# Patient Record
Sex: Male | Born: 1990 | Race: White | Hispanic: No | Marital: Single | Smoking: Former smoker
Health system: Southern US, Community
[De-identification: ages and names within clinical notes are randomized; demographics above are authoritative.]

## PROBLEM LIST (undated history)

## (undated) HISTORY — PX: ANKLE SURGERY: SHX546

---
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
## 2014-09-29 ENCOUNTER — Emergency Department (HOSPITAL_COMMUNITY)
Admission: EM | Admit: 2014-09-29 | Discharge: 2014-09-29 | Disposition: A | Discharge status: Home / Self Care | Payer: Self-pay

## 2014-09-29 NOTE — ED Notes (Signed)
No answer

## 2014-10-06 ENCOUNTER — Emergency Department (HOSPITAL_COMMUNITY)
Admission: EM | Admit: 2014-10-06 | Discharge: 2014-10-06 | Disposition: A | Discharge status: Home / Self Care | Payer: BC Managed Care – PPO | Attending: Emergency Medicine | Admitting: Emergency Medicine

## 2014-10-06 ENCOUNTER — Emergency Department (HOSPITAL_COMMUNITY): Payer: BC Managed Care – PPO

## 2014-10-06 ENCOUNTER — Encounter (HOSPITAL_COMMUNITY): Payer: Self-pay | Admitting: Emergency Medicine

## 2014-10-06 DIAGNOSIS — S99911A Unspecified injury of right ankle, initial encounter: Secondary | ICD-10-CM | POA: Diagnosis present

## 2014-10-06 DIAGNOSIS — Y92310 Basketball court as the place of occurrence of the external cause: Secondary | ICD-10-CM | POA: Diagnosis not present

## 2014-10-06 DIAGNOSIS — Y9367 Activity, basketball: Secondary | ICD-10-CM | POA: Diagnosis not present

## 2014-10-06 DIAGNOSIS — S93401A Sprain of unspecified ligament of right ankle, initial encounter: Secondary | ICD-10-CM

## 2014-10-06 DIAGNOSIS — S93491A Sprain of other ligament of right ankle, initial encounter: Secondary | ICD-10-CM | POA: Diagnosis not present

## 2014-10-06 DIAGNOSIS — W2105XA Struck by basketball, initial encounter: Secondary | ICD-10-CM | POA: Insufficient documentation

## 2014-10-06 NOTE — ED Notes (Signed)
Pain rt ankle .Has been hurting for few weeks, but worse when playing basketball yesterday, and heard a 'pop",

## 2014-10-06 NOTE — ED Notes (Addendum)
Patient states "I rolled my right ankle yesterday playing basketball." Patient ambulatory at triage.

## 2014-10-06 NOTE — ED Provider Notes (Signed)
CSN: [id number]     Arrival date & time 10/06/14  1740 History  This chart was scribed for non-physician practitioner Pauline Ausammy Manjot Hinks, PA-C working with Donnetta HutchingBrian Cook, MD by Littie Deedsichard Sun, ED Scribe. This patient was seen in room [facility] and the patient's care was started at 6:34 PM.      Chief Complaint  Patient presents with  . Ankle Pain      The history is provided by the patient. No language interpreter was used.   HPI Comments: Edwin Oneill is a 23 y.o. male who presents to the Emergency Department complaining of sudden onset, constant right ankle pain with swelling that began yesterday after he rolled his right ankle while playing basketball. He noticed popping during the injury. He has noted injuring his ankle while playing football 3 years ago. Patient has applied ice which has decreased swelling and has taken Advil that has alleviated some of the pain. He denies any other injuries.   History reviewed. No pertinent past medical history. History reviewed. No pertinent past surgical history. History reviewed. No pertinent family history. History  Substance Use Topics  . Smoking status: Never Smoker   . Smokeless tobacco: Not on file  . Alcohol Use: Yes     Comment: occ    Review of Systems  Constitutional: Negative for fever.  Genitourinary: Negative for flank pain.  Musculoskeletal: Positive for arthralgias and joint swelling. Negative for back pain.  Skin: Negative for rash and wound.  Neurological: Negative for headaches.  Psychiatric/Behavioral: Negative for hallucinations.  All other systems reviewed and are negative.     Allergies  Coppertone spf4  Home Medications   Prior to Admission medications   Not on File   BP 116/69  Pulse 65  Temp(Src) 98.8 F (37.1 C) (Oral)  Ht 6\' 3"  (1.905 m)  Wt 165 lb (74.844 kg)  BMI 20.62 kg/m2  SpO2 100% Physical Exam  Nursing note and vitals reviewed. Constitutional: He is oriented to person, place, and time.  He appears well-developed and well-nourished. No distress.  HENT:  Head: Normocephalic and atraumatic.  Mouth/Throat: Oropharynx is clear and moist. No oropharyngeal exudate.  Eyes: Pupils are equal, round, and reactive to light.  Cardiovascular: Normal rate, regular rhythm, normal heart sounds and intact distal pulses.   No murmur heard. Pulmonary/Chest: Effort normal and breath sounds normal. No respiratory distress. He has no wheezes. He has no rales.  Musculoskeletal: He exhibits tenderness. He exhibits no edema.  Tenderness at the anterior and medial right ankle. Mild soft tissue swelling. No erythema or bony deformity. Sensation intact.  Compartments of the right LE are soft.  DP pulse is brisk  Neurological: He is alert and oriented to person, place, and time. No cranial nerve deficit.  Skin: Skin is warm and dry. No rash noted.  Psychiatric: He has a normal mood and affect. His behavior is normal.    ED Course  Procedures  DIAGNOSTIC STUDIES: Oxygen Saturation is 100% on RA, nml by my interpretation.    COORDINATION OF CARE: 6:39 PM-Discussed treatment plan which includes crutches and a work note with pt at bedside and pt agreed to plan.   Labs Review Labs Reviewed - No data to display  Imaging Review Dg Ankle Complete Right  10/06/2014   CLINICAL DATA:  Popping sensation right ankle for 1 week. Patient was playing basketball and twisted the ankle.  EXAM: RIGHT ANKLE - COMPLETE 3+ VIEW  COMPARISON:  None.  FINDINGS: There is no evidence of  fracture, dislocation, or joint effusion. There is no evidence of arthropathy or other focal bone abnormality. Soft tissues are unremarkable.  IMPRESSION: No acute osseous injury of the right ankle.   Electronically Signed   By: Elige KoHetal  Patel   On: 10/06/2014 18:15     EKG Interpretation None      ASO applied and crutches given.  Pain improved , remains NV intact  MDM   Final diagnoses:  Ankle sprain, right, initial encounter     Pt is well appearing.  Likely ankle sprain.  Agrees to RICE and orthopedic f/u if needed.    I personally performed the services described in this documentation, which was scribed in my presence. The recorded information has been reviewed and is accurate.    Wynonia Medero L. Lyell Clugston, PA-C 10/08/14 1249

## 2014-10-06 NOTE — Discharge Instructions (Signed)
Ankle Sprain  An ankle sprain is an injury to the strong, fibrous tissues (ligaments) that hold your ankle bones together.   HOME CARE   · Put ice on your ankle for 1-2 days or as told by your doctor.  ¨ Put ice in a plastic bag.  ¨ Place a towel between your skin and the bag.  ¨ Leave the ice on for 15-20 minutes at a time, every 2 hours while you are awake.  · Only take medicine as told by your doctor.  · Raise (elevate) your injured ankle above the level of your heart as much as possible for 2-3 days.  · Use crutches if your doctor tells you to. Slowly put your own weight on the affected ankle. Use the crutches until you can walk without pain.  · If you have a plaster splint:  ¨ Do not rest it on anything harder than a pillow for 24 hours.  ¨ Do not put weight on it.  ¨ Do not get it wet.  ¨ Take it off to shower or bathe.  · If given, use an elastic wrap or support stocking for support. Take the wrap off if your toes lose feeling (numb), tingle, or turn cold or blue.  · If you have an air splint:  ¨ Add or let out air to make it comfortable.  ¨ Take it off at night and to shower and bathe.  ¨ Wiggle your toes and move your ankle up and down often while you are wearing it.  GET HELP IF:  · You have rapidly increasing bruising or puffiness (swelling).  · Your toes feel very cold.  · You lose feeling in your foot.  · Your medicine does not help your pain.  GET HELP RIGHT AWAY IF:   · Your toes lose feeling (numb) or turn blue.  · You have severe pain that is increasing.  MAKE SURE YOU:   · Understand these instructions.  · Will watch your condition.  · Will get help right away if you are not doing well or get worse.  Document Released: 05/30/2008 Document Revised: 04/28/2014 Document Reviewed: 06/25/2012  [employer]® Patient Information ©2015 [employer]. This information is not intended to replace advice given to you by your health care provider. Make sure you discuss any questions you have with your health care  provider.

## 2014-10-10 NOTE — ED Provider Notes (Signed)
Medical screening examination/treatment/procedure(s) were performed by non-physician practitioner and as supervising physician I was immediately available for consultation/collaboration.   EKG Interpretation None       Donnetta HutchingBrian Yuvan Medinger, MD 10/10/14 1407

## 2015-09-15 IMAGING — CR DG ANKLE COMPLETE 3+V*R*
3 series · 3 of 3 positions shown · non-contrast
Comparison: None.

CLINICAL DATA: Popping sensation right ankle for 1 week. Patient
was playing basketball and twisted the ankle.

EXAM:
RIGHT ANKLE - COMPLETE 3+ VIEW

[view not recorded (1 of 3)]
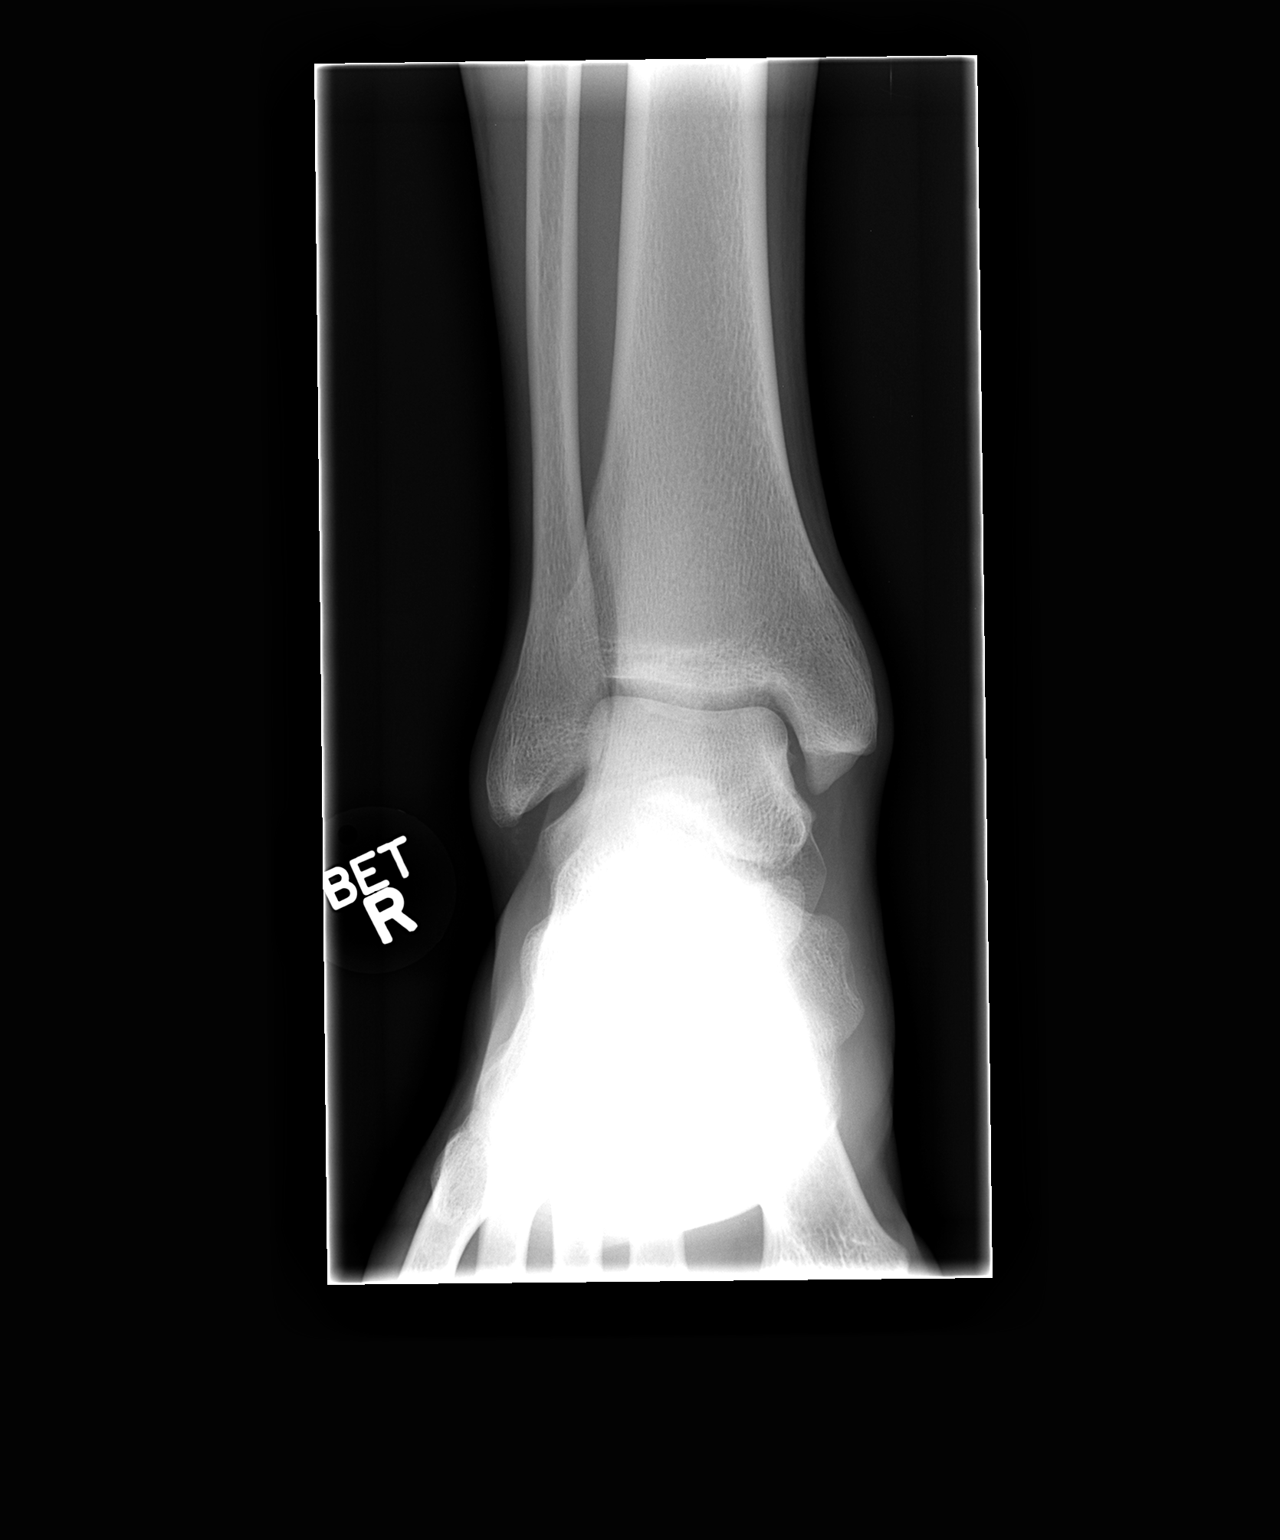

[view not recorded (2 of 3)]
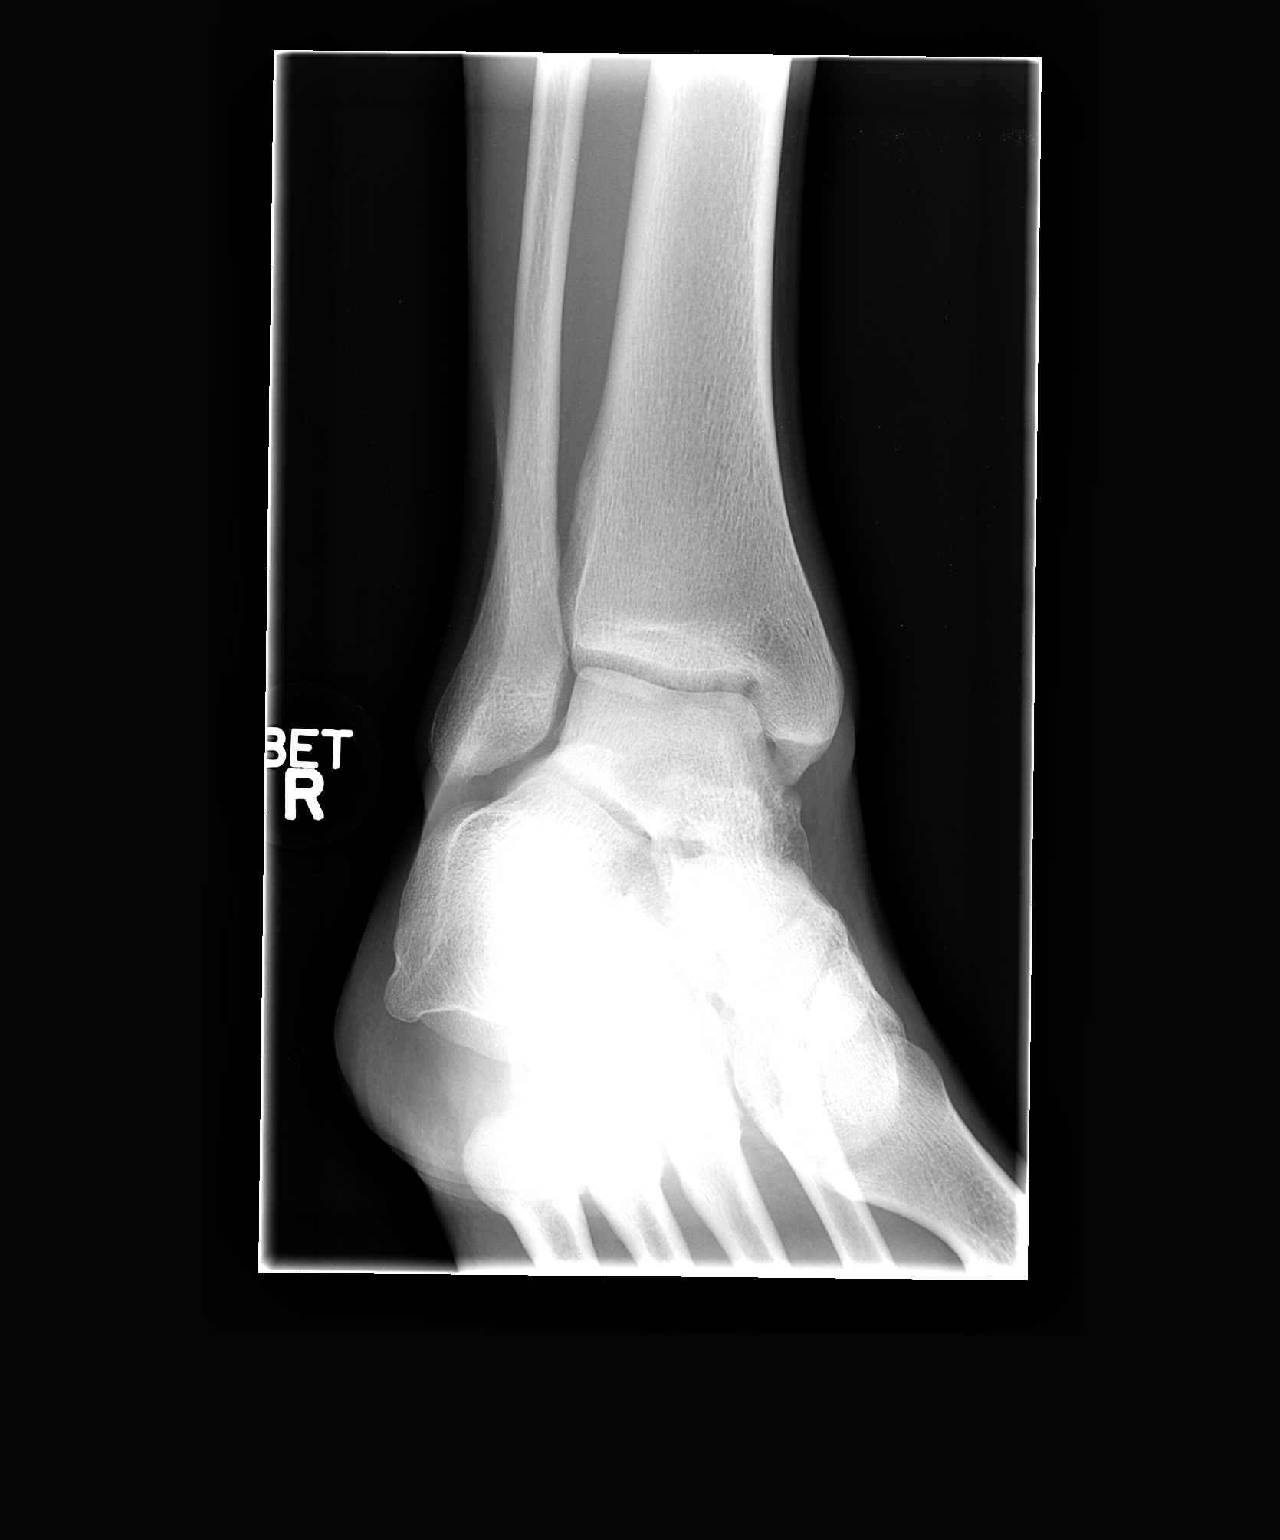

[view not recorded (3 of 3)]
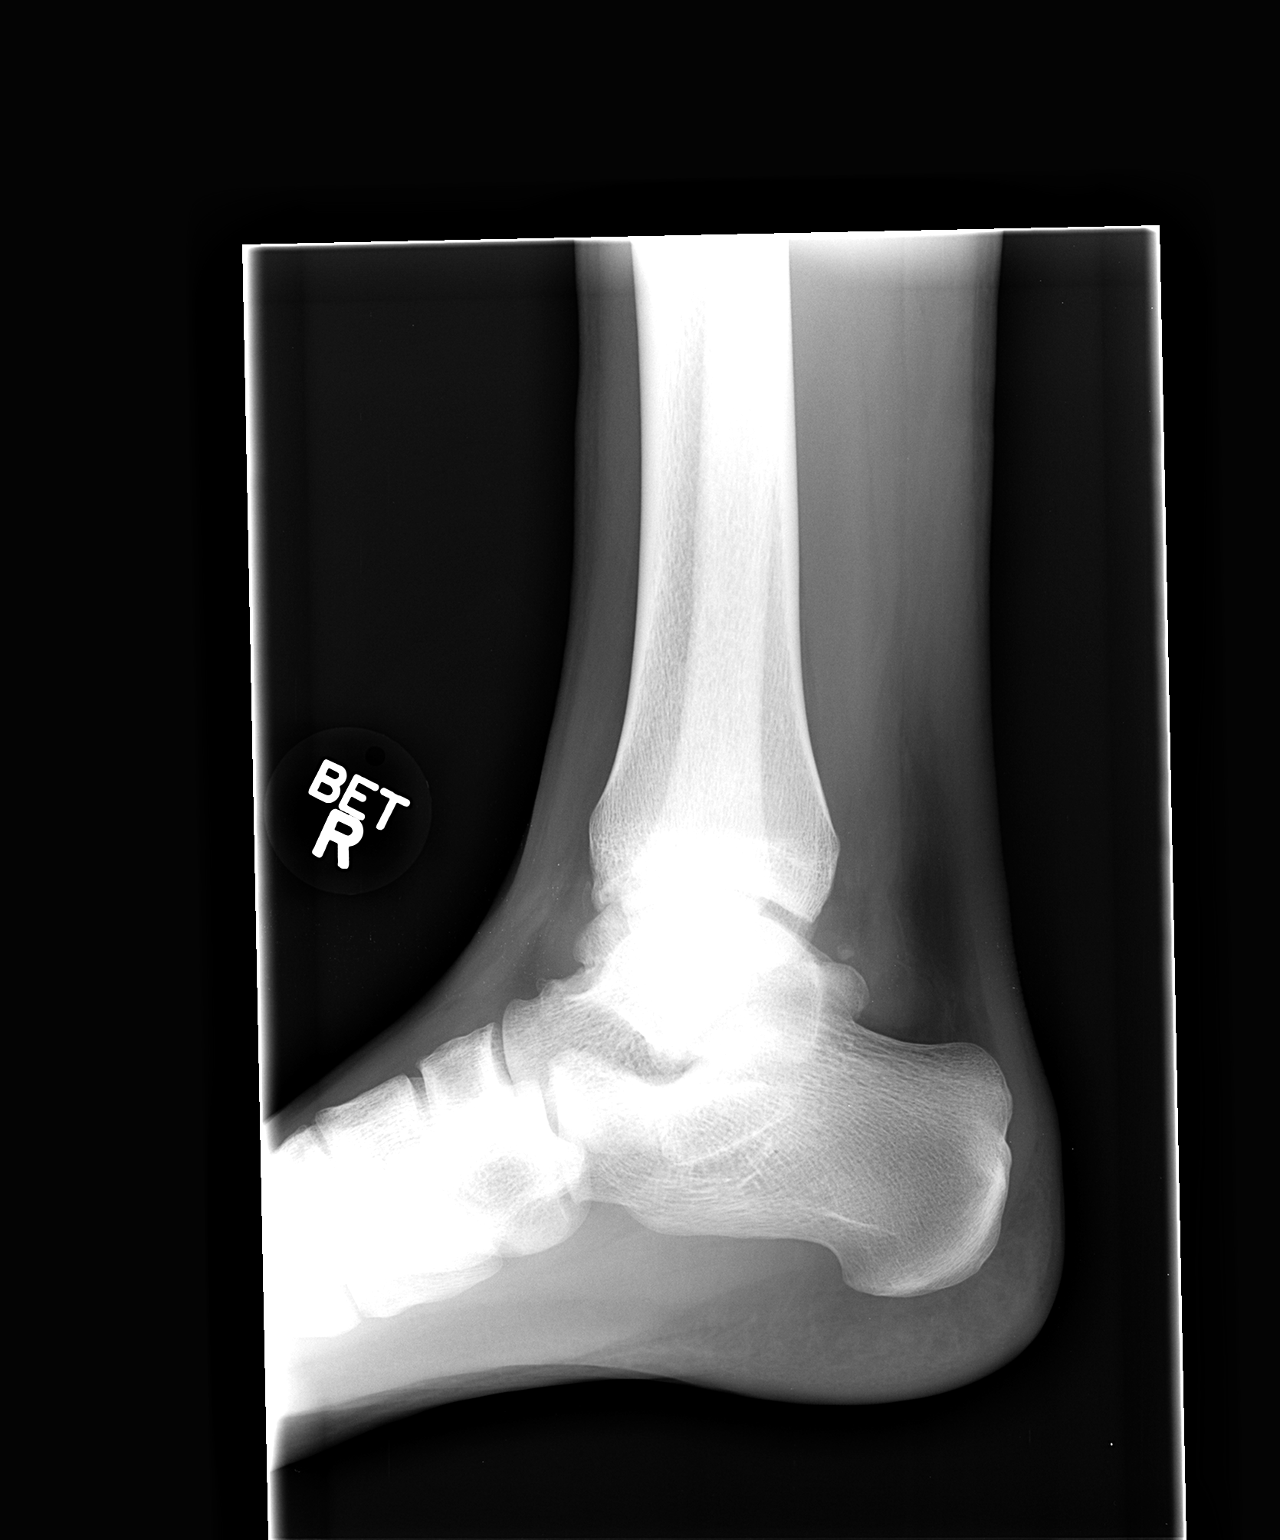

[3 of 3 positions shown; findings below may reference images not displayed]

FINDINGS: There is no evidence of fracture, dislocation, or joint effusion.
There is no evidence of arthropathy or other focal bone abnormality.
Soft tissues are unremarkable.
IMPRESSION: No acute osseous injury of the right ankle.

## 2017-03-04 ENCOUNTER — Encounter (HOSPITAL_COMMUNITY): Payer: Self-pay | Admitting: Emergency Medicine

## 2017-03-04 ENCOUNTER — Emergency Department (HOSPITAL_COMMUNITY)
Admission: EM | Admit: 2017-03-04 | Discharge: 2017-03-04 | Disposition: A | Discharge status: Home / Self Care | Payer: Self-pay | Attending: Emergency Medicine | Admitting: Emergency Medicine

## 2017-03-04 DIAGNOSIS — F17210 Nicotine dependence, cigarettes, uncomplicated: Secondary | ICD-10-CM | POA: Insufficient documentation

## 2017-03-04 DIAGNOSIS — R112 Nausea with vomiting, unspecified: Secondary | ICD-10-CM

## 2017-03-04 DIAGNOSIS — E860 Dehydration: Secondary | ICD-10-CM | POA: Insufficient documentation

## 2017-03-04 LAB — CBC WITH DIFFERENTIAL/PLATELET
Basophils Absolute: 0 10*3/uL (ref 0.0–0.1)
Basophils Relative: 0 %
EOS ABS: 0 10*3/uL (ref 0.0–0.7)
EOS PCT: 0 %
HCT: 44.9 % (ref 39.0–52.0)
Hemoglobin: 15.5 g/dL (ref 13.0–17.0)
LYMPHS ABS: 1.3 10*3/uL (ref 0.7–4.0)
LYMPHS PCT: 11 %
MCH: 31.3 pg (ref 26.0–34.0)
MCHC: 34.5 g/dL (ref 30.0–36.0)
MCV: 90.7 fL (ref 78.0–100.0)
MONO ABS: 0.5 10*3/uL (ref 0.1–1.0)
MONOS PCT: 4 %
Neutro Abs: 10 10*3/uL — ABNORMAL HIGH (ref 1.7–7.7)
Neutrophils Relative %: 85 %
PLATELETS: 245 10*3/uL (ref 150–400)
RBC: 4.95 MIL/uL (ref 4.22–5.81)
RDW: 13.2 % (ref 11.5–15.5)
WBC: 11.8 10*3/uL — ABNORMAL HIGH (ref 4.0–10.5)

## 2017-03-04 LAB — COMPREHENSIVE METABOLIC PANEL
ALT: 20 U/L (ref 17–63)
ANION GAP: 10 (ref 5–15)
AST: 27 U/L (ref 15–41)
Albumin: 4.9 g/dL (ref 3.5–5.0)
Alkaline Phosphatase: 48 U/L (ref 38–126)
BUN: 11 mg/dL (ref 6–20)
CALCIUM: 10.5 mg/dL — AB (ref 8.9–10.3)
CHLORIDE: 108 mmol/L (ref 101–111)
CO2: 25 mmol/L (ref 22–32)
CREATININE: 1.08 mg/dL (ref 0.61–1.24)
Glucose, Bld: 93 mg/dL (ref 65–99)
Potassium: 3.7 mmol/L (ref 3.5–5.1)
SODIUM: 143 mmol/L (ref 135–145)
Total Bilirubin: 1.7 mg/dL — ABNORMAL HIGH (ref 0.3–1.2)
Total Protein: 8.3 g/dL — ABNORMAL HIGH (ref 6.5–8.1)

## 2017-03-04 MED ORDER — SODIUM CHLORIDE 0.9 % IV BOLUS (SEPSIS)
1000.0000 mL | Freq: Once | INTRAVENOUS | Status: AC
  Administered 2017-03-04: 1000 mL via INTRAVENOUS

## 2017-03-04 MED ORDER — ONDANSETRON 8 MG PO TBDP: 8.0000 mg | ORAL_TABLET | Freq: Three times a day (TID) | ORAL | 0 refills | Status: DC | PRN

## 2017-03-04 NOTE — ED Triage Notes (Signed)
Pt reports facial numbness since last pm Also reports weakness- (pt on cell phone and reluctant to not use while checking in) No physician

## 2017-03-04 NOTE — ED Triage Notes (Signed)
Pt appears anxious stating that he feels that he will pass out while walking around waiting room- encouraged to return to his wheel chair

## 2017-03-04 NOTE — ED Notes (Signed)
Pt reports his children recently had to flu and bronchitis.

## 2017-03-04 NOTE — ED Provider Notes (Signed)
[facility]-EMERGENCY DEPT Provider Note   CSN: [id number] Arrival date & time: 03/04/17  1253     History   Chief Complaint Chief Complaint  Patient presents with  . Weakness    HPI Edwin Oneill is a 26 y.o. male.  HPI Patient presents to the emergency department with complaints of nausea vomiting and generalized weakness.  He reports lightheaded with standing.  He reports chills without documented fever.  No blood in his vomit.  No blood in stool.  Denies diarrhea.  Reports crampy abdominal pain.  Symptoms are moderate in severity.  No other complaints.  No chest pain shortness breath.   History reviewed. No pertinent past medical history.  Patient Active Problem List   Diagnosis Date Noted  . KNEE PAIN, RIGHT 10/11/2007    History reviewed. No pertinent surgical history.     Home Medications    Prior to Admission medications   Medication Sig Start Date End Date Taking? Authorizing Provider  ondansetron (ZOFRAN ODT) 8 MG disintegrating tablet Take 1 tablet (8 mg total) by mouth every 8 (eight) hours as needed for nausea or vomiting. 03/04/17   Azalia Bilis, MD    Family History History reviewed. No pertinent family history.  Social History Social History  Substance Use Topics  . Smoking status: Current Every Day Smoker    Packs/day: 0.50    Types: Cigarettes  . Smokeless tobacco: Not on file  . Alcohol use Yes     Comment: occ     Allergies   Ray block sunscreen   Review of Systems Review of Systems  All other systems reviewed and are negative.    Physical Exam Updated Vital Signs BP 109/71   Pulse (!) 52   Temp 97.6 F (36.4 C) (Oral)   Resp 14   Ht 6\' 3"  (1.905 m)   Wt 185 lb (83.9 kg)   SpO2 100%   BMI 23.12 kg/m   Physical Exam  Constitutional: He is oriented to person, place, and time. He appears well-developed and well-nourished.  HENT:  Head: Normocephalic and atraumatic.  Eyes: EOM are normal.  Neck: Normal range of motion.    Cardiovascular: Normal rate, regular rhythm and normal heart sounds.   Pulmonary/Chest: Effort normal and breath sounds normal. No respiratory distress.  Abdominal: Soft. He exhibits no distension. There is no tenderness.  Musculoskeletal: Normal range of motion.  Neurological: He is alert and oriented to person, place, and time.  Skin: Skin is warm and dry.  Psychiatric: He has a normal mood and affect. Judgment normal.  Nursing note and vitals reviewed.    ED Treatments / Results  Labs (all labs ordered are listed, but only abnormal results are displayed) Labs Reviewed  CBC WITH DIFFERENTIAL/PLATELET - Abnormal; Notable for the following:       Result Value   WBC 11.8 (*)    Neutro Abs 10.0 (*)    All other components within normal limits  COMPREHENSIVE METABOLIC PANEL - Abnormal; Notable for the following:    Calcium 10.5 (*)    Total Protein 8.3 (*)    Total Bilirubin 1.7 (*)    All other components within normal limits    EKG  EKG Interpretation None       Radiology No results found.  Procedures Procedures (including critical care time)  Medications Ordered in ED Medications  sodium chloride 0.9 % bolus 1,000 mL (1,000 mLs Intravenous New Bag/Given 03/04/17 1426)     Initial Impression / Assessment and  Plan / ED Course  I have reviewed the triage vital signs and the nursing notes.  Pertinent labs & imaging results that were available during my care of the patient were reviewed by me and considered in my medical decision making (see chart for details).     Patient given IV fluids.  Feels much better at this time.  Repeat abdominal exam without focal tenderness.  Likely viral process.  Discharge home in good condition.  Primary care follow-up.  Patient understands to return to the ER for new or worsening symptoms  Final Clinical Impressions(s) / ED Diagnoses   Final diagnoses:  Nausea and vomiting in adult  Dehydration    New Prescriptions New  Prescriptions   ONDANSETRON (ZOFRAN ODT) 8 MG DISINTEGRATING TABLET    Take 1 tablet (8 mg total) by mouth every 8 (eight) hours as needed for nausea or vomiting.     Azalia BilisKevin Jamir Rone, MD 03/04/17 [phone]

## 2017-03-04 NOTE — ED Triage Notes (Signed)
Pt reports facial numbness with generalized weakness and dry heaves since last night.

## 2019-06-11 ENCOUNTER — Emergency Department (HOSPITAL_COMMUNITY): Payer: Self-pay

## 2019-06-11 ENCOUNTER — Other Ambulatory Visit: Payer: Self-pay

## 2019-06-11 ENCOUNTER — Emergency Department (HOSPITAL_COMMUNITY)
Admission: EM | Admit: 2019-06-11 | Discharge: 2019-06-11 | Disposition: A | Discharge status: Home / Self Care | Payer: Self-pay | Attending: Emergency Medicine | Admitting: Emergency Medicine

## 2019-06-11 ENCOUNTER — Ambulatory Visit (HOSPITAL_COMMUNITY): Payer: Self-pay

## 2019-06-11 ENCOUNTER — Encounter (HOSPITAL_COMMUNITY): Payer: Self-pay | Admitting: Emergency Medicine

## 2019-06-11 DIAGNOSIS — R112 Nausea with vomiting, unspecified: Secondary | ICD-10-CM | POA: Insufficient documentation

## 2019-06-11 DIAGNOSIS — R1084 Generalized abdominal pain: Secondary | ICD-10-CM | POA: Insufficient documentation

## 2019-06-11 DIAGNOSIS — R197 Diarrhea, unspecified: Secondary | ICD-10-CM | POA: Insufficient documentation

## 2019-06-11 DIAGNOSIS — F17210 Nicotine dependence, cigarettes, uncomplicated: Secondary | ICD-10-CM | POA: Insufficient documentation

## 2019-06-11 LAB — COMPREHENSIVE METABOLIC PANEL
ALT: 22 U/L (ref 0–44)
AST: 24 U/L (ref 15–41)
Albumin: 4.5 g/dL (ref 3.5–5.0)
Alkaline Phosphatase: 45 U/L (ref 38–126)
Anion gap: 11 (ref 5–15)
BUN: 10 mg/dL (ref 6–20)
CO2: 22 mmol/L (ref 22–32)
Calcium: 9.8 mg/dL (ref 8.9–10.3)
Chloride: 106 mmol/L (ref 98–111)
Creatinine, Ser: 1.05 mg/dL (ref 0.61–1.24)
GFR calc Af Amer: >60 mL/min (ref >60)
GFR calc non Af Amer: >60 mL/min (ref >60)
Glucose, Bld: 107 mg/dL — ABNORMAL HIGH (ref 70–99)
Potassium: 3.9 mmol/L (ref 3.5–5.1)
Sodium: 139 mmol/L (ref 135–145)
Total Bilirubin: 1.3 mg/dL — ABNORMAL HIGH (ref 0.3–1.2)
Total Protein: 7.6 g/dL (ref 6.5–8.1)

## 2019-06-11 LAB — LIPASE, BLOOD: Lipase: 28 U/L (ref 11–51)

## 2019-06-11 LAB — CBC
HCT: 44.5 % (ref 39.0–52.0)
Hemoglobin: 15.4 g/dL (ref 13.0–17.0)
MCH: 31.6 pg (ref 26.0–34.0)
MCHC: 34.6 g/dL (ref 30.0–36.0)
MCV: 91.2 fL (ref 80.0–100.0)
Platelets: 228 10*3/uL (ref 150–400)
RBC: 4.88 MIL/uL (ref 4.22–5.81)
RDW: 12.6 % (ref 11.5–15.5)
WBC: 9.3 10*3/uL (ref 4.0–10.5)
nRBC: 0 % (ref 0.0–0.2)

## 2019-06-11 MED ORDER — ONDANSETRON 4 MG PO TBDP
ORAL_TABLET | ORAL | Status: AC
  Filled 2019-06-11: qty 1

## 2019-06-11 MED ORDER — SODIUM CHLORIDE 0.9% FLUSH
3.0000 mL | Freq: Once | INTRAVENOUS | Status: AC
  Administered 2019-06-11: 3 mL via INTRAVENOUS

## 2019-06-11 MED ORDER — SODIUM CHLORIDE 0.9 % IV SOLN
1000.0000 mL | INTRAVENOUS | Status: DC
  Administered 2019-06-11: 1000 mL via INTRAVENOUS

## 2019-06-11 MED ORDER — IOHEXOL 300 MG/ML  SOLN: 100.0000 mL | Freq: Once | INTRAMUSCULAR | Status: DC | PRN

## 2019-06-11 MED ORDER — SODIUM CHLORIDE 0.9 % IV BOLUS (SEPSIS)
1000.0000 mL | Freq: Once | INTRAVENOUS | Status: AC
  Administered 2019-06-11: 1000 mL via INTRAVENOUS

## 2019-06-11 MED ORDER — ONDANSETRON 4 MG PO TBDP
4.0000 mg | ORAL_TABLET | Freq: Once | ORAL | Status: AC | PRN
  Administered 2019-06-11: 4 mg via ORAL

## 2019-06-11 MED ORDER — PROCHLORPERAZINE EDISYLATE 10 MG/2ML IJ SOLN
5.0000 mg | Freq: Once | INTRAMUSCULAR | Status: AC
  Administered 2019-06-11: 5 mg via INTRAVENOUS
  Filled 2019-06-11: qty 2

## 2019-06-11 NOTE — ED Triage Notes (Signed)
Pt c/o n/v/d and nasal congestion since 0530 this am. Denies fever.

## 2019-06-11 NOTE — ED Notes (Signed)
Pt was informed that we need a urine sample. 

## 2019-06-11 NOTE — ED Notes (Signed)
Pt is requesting to be discharged.  Jackquline Berlin, PA in to speak with pt.

## 2019-06-11 NOTE — ED Notes (Signed)
Pt not able to void at this time.

## 2019-06-11 NOTE — ED Provider Notes (Signed)
[facility] EMERGENCY DEPARTMENT Provider Note   CSN: [id number] Arrival date & time: 06/11/19  1327     History   Chief Complaint Chief Complaint  Patient presents with  . Emesis    HPI Edwin Oneill is a 28 y.o. male.     Patient is a 28 year old male who presents to the emergency department with nausea, vomiting, diarrhea, and congestion.  The patient states this problem started approximately 5:30 AM.  He says during the day today he has had about 6 episodes of vomiting and a couple of episodes of diarrhea.  He presents now with some congestion present, but also has diffuse abdominal pain.  He says he does not have much of an appetite.  He has not measured a temperature elevation.  He has not had a loss of taste or smell.  He has not been around anyone with known diagnosis of COVID-19.  He has not been traveling recently.  He does not have any known medical issues at this time.  The history is provided by the patient.  Emesis Associated symptoms: abdominal pain and diarrhea   Associated symptoms: no arthralgias and no cough     History reviewed. No pertinent past medical history.  Patient Active Problem List   Diagnosis Date Noted  . KNEE PAIN, RIGHT 10/11/2007    Past Surgical History:  Procedure Laterality Date  . ANKLE SURGERY Right         Home Medications    Prior to Admission medications   Medication Sig Start Date End Date Taking? Authorizing Provider  ondansetron (ZOFRAN ODT) 8 MG disintegrating tablet Take 1 tablet (8 mg total) by mouth every 8 (eight) hours as needed for nausea or vomiting. 03/04/17   Jola Schmidt, MD    Family History No family history on file.  Social History Social History   Tobacco Use  . Smoking status: Current Every Day Smoker    Packs/day: 0.50    Types: Cigarettes  . Smokeless tobacco: Never Used  Substance Use Topics  . Alcohol use: Yes    Comment: occ  . Drug use: No     Allergies   Solbar pf spf15    Review of Systems Review of Systems  Constitutional: Negative for activity change.       All ROS Neg except as noted in HPI  HENT: Positive for congestion. Negative for nosebleeds.   Eyes: Negative for photophobia and discharge.  Respiratory: Negative for cough, shortness of breath and wheezing.   Cardiovascular: Negative for chest pain and palpitations.  Gastrointestinal: Positive for abdominal pain, diarrhea and vomiting. Negative for blood in stool.  Genitourinary: Negative for dysuria, frequency and hematuria.  Musculoskeletal: Negative for arthralgias, back pain and neck pain.  Skin: Negative.   Neurological: Negative for dizziness, seizures and speech difficulty.  Psychiatric/Behavioral: Negative for confusion and hallucinations.     Physical Exam Updated Vital Signs BP 116/80   Pulse 60   Temp 98 F (36.7 C)   Resp (!) 22   Ht 6\' 3"  (1.905 m)   Wt 83 kg   SpO2 100%   BMI 22.87 kg/m   Physical Exam Vitals signs and nursing note reviewed.  Constitutional:      Appearance: He is well-developed. He is not toxic-appearing.  HENT:     Head: Normocephalic.     Right Ear: Tympanic membrane and external ear normal.     Left Ear: Tympanic membrane and external ear normal.  Eyes:  General: Lids are normal.     Pupils: Pupils are equal, round, and reactive to light.  Neck:     Musculoskeletal: Normal range of motion and neck supple.     Vascular: No carotid bruit.  Cardiovascular:     Rate and Rhythm: Normal rate and regular rhythm.     Pulses: Normal pulses.     Heart sounds: Normal heart sounds.  Pulmonary:     Effort: No respiratory distress.     Breath sounds: Normal breath sounds.  Abdominal:     General: Abdomen is flat. Bowel sounds are normal.     Palpations: Abdomen is soft. There is no hepatomegaly or splenomegaly.     Tenderness: There is generalized abdominal tenderness. There is no right CVA tenderness, left CVA tenderness or guarding.   Musculoskeletal: Normal range of motion.  Lymphadenopathy:     Head:     Right side of head: No submandibular adenopathy.     Left side of head: No submandibular adenopathy.     Cervical: No cervical adenopathy.  Skin:    General: Skin is warm and dry.  Neurological:     Mental Status: He is alert and oriented to person, place, and time.     Cranial Nerves: No cranial nerve deficit.     Sensory: No sensory deficit.  Psychiatric:        Speech: Speech normal.      ED Treatments / Results  Labs (all labs ordered are listed, but only abnormal results are displayed) Labs Reviewed  COMPREHENSIVE METABOLIC PANEL - Abnormal; Notable for the following components:      Result Value   Glucose, Bld 107 (*)    Total Bilirubin 1.3 (*)    All other components within normal limits  LIPASE, BLOOD  CBC  URINALYSIS, ROUTINE W REFLEX MICROSCOPIC    EKG    Radiology No results found.  Procedures Procedures (including critical care time)  Medications Ordered in ED Medications  sodium chloride flush (NS) 0.9 % injection 3 mL (has no administration in time range)  ondansetron (ZOFRAN-ODT) 4 MG disintegrating tablet (has no administration in time range)  ondansetron (ZOFRAN-ODT) disintegrating tablet 4 mg (4 mg Oral Given 06/11/19 1356)     Initial Impression / Assessment and Plan / ED Course  I have reviewed the triage vital signs and the nursing notes.  Pertinent labs & imaging results that were available during my care of the patient were reviewed by me and considered in my medical decision making (see chart for details).          Final Clinical Impressions(s) / ED Diagnoses MDM  Patient presents to the emergency department with nausea, vomiting, and congestion.  On examination he is noted to have diffuse abdominal pain.  Lab test have been submitted to the lab.  The patient has not had any vomiting since being in the emergency department.  Patient speaks in complete  sentences without problem.  The lipase test is normal.  The comprehensive metabolic panel is nonacute.  The anion gap is normal at 11.  The complete blood count is nonacute.  I have informed the patient of these findings, and the need for CT scan with contrast due to his continued pain since early this morning.  Patient gives permission for the testing.  Urinalysis pending.  Patient states that he cannot give a urine specimen at this time.  4:15 PM.  The patient requested that I come to his room to talk with  him.  He says that he no longer wants to have the CT scan done and he does not want to give a urine specimen.  With nurse Daphine DeutscherMartin present, I discussed with the patient the importance of having the CT scan for completion and thoroughness of his care.  I discussed with him the possible dangers of not having a complete work-up.  The patient acknowledges understanding, and accepts responsibility for any actions that occur as a result of him leaving the hospital AGAINST MEDICAL ADVICE.  The patient chose to leave the hospital AGAINST MEDICAL ADVICE in spite of of directions to stay and have his testing completed.  The patient was asked to see his primary physician if he changes his mind about the CT scan and the urinalysis.  He is advised to return to the emergency department if there are any changes in his condition, worsening of his symptoms, problems, or concerns.   Final diagnoses:  Nausea vomiting and diarrhea  Generalized abdominal pain    ED Discharge Orders    None       Ivery QualeBryant, Sophiagrace Benbrook, Cordelia Poche-C 06/11/19 2137    Bethann BerkshireZammit, Joseph, MD 06/13/19 1627

## 2019-06-11 NOTE — Discharge Instructions (Addendum)
Please see your family doctor for the CT scan, or return to the emergency department if you have any changes in your symptoms.

## 2019-07-31 ENCOUNTER — Other Ambulatory Visit: Payer: Self-pay

## 2019-07-31 ENCOUNTER — Encounter (HOSPITAL_COMMUNITY): Payer: Self-pay | Admitting: Emergency Medicine

## 2019-07-31 ENCOUNTER — Emergency Department (HOSPITAL_COMMUNITY): Payer: Self-pay

## 2019-07-31 ENCOUNTER — Emergency Department (HOSPITAL_COMMUNITY)
Admission: EM | Admit: 2019-07-31 | Discharge: 2019-07-31 | Disposition: A | Discharge status: Home / Self Care | Payer: Self-pay | Attending: Emergency Medicine | Admitting: Emergency Medicine

## 2019-07-31 DIAGNOSIS — F17210 Nicotine dependence, cigarettes, uncomplicated: Secondary | ICD-10-CM | POA: Insufficient documentation

## 2019-07-31 DIAGNOSIS — Y9359 Activity, other involving other sports and athletics played individually: Secondary | ICD-10-CM | POA: Insufficient documentation

## 2019-07-31 DIAGNOSIS — S60222A Contusion of left hand, initial encounter: Secondary | ICD-10-CM | POA: Insufficient documentation

## 2019-07-31 DIAGNOSIS — S63502A Unspecified sprain of left wrist, initial encounter: Secondary | ICD-10-CM

## 2019-07-31 DIAGNOSIS — W2209XA Striking against other stationary object, initial encounter: Secondary | ICD-10-CM | POA: Insufficient documentation

## 2019-07-31 DIAGNOSIS — Y9289 Other specified places as the place of occurrence of the external cause: Secondary | ICD-10-CM | POA: Insufficient documentation

## 2019-07-31 DIAGNOSIS — S6392XA Sprain of unspecified part of left wrist and hand, initial encounter: Secondary | ICD-10-CM | POA: Insufficient documentation

## 2019-07-31 DIAGNOSIS — Y999 Unspecified external cause status: Secondary | ICD-10-CM | POA: Insufficient documentation

## 2019-07-31 NOTE — ED Provider Notes (Signed)
[facility] EMERGENCY DEPARTMENT Provider Note   CSN: [id number] Arrival date & time: 07/31/19  0006    History   Chief Complaint Chief Complaint  Patient presents with  . Hand Injury    HPI Edwin Oneill X Sandner is a 28 y.o. male.   The history is provided by the patient.  He states that he was working on a punching bag and missed in his left hand struck the wall.  He is complaining of pain throughout his left hand and wrist.  Pain is rated at 10/10.  He did apply ice and take ibuprofen without relief.  He denies other injury.  History reviewed. No pertinent past medical history.  Patient Active Problem List   Diagnosis Date Noted  . KNEE PAIN, RIGHT 10/11/2007    Past Surgical History:  Procedure Laterality Date  . ANKLE SURGERY Right         Home Medications    Prior to Admission medications   Medication Sig Start Date End Date Taking? Authorizing Provider  ondansetron (ZOFRAN ODT) 8 MG disintegrating tablet Take 1 tablet (8 mg total) by mouth every 8 (eight) hours as needed for nausea or vomiting. 03/04/17   Azalia Bilisampos, Kevin, MD    Family History History reviewed. No pertinent family history.  Social History Social History   Tobacco Use  . Smoking status: Current Every Day Smoker    Packs/day: 0.50    Types: Cigarettes  . Smokeless tobacco: Never Used  Substance Use Topics  . Alcohol use: Yes    Comment: occ  . Drug use: No     Allergies   Solbar pf spf15   Review of Systems Review of Systems  All other systems reviewed and are negative.    Physical Exam Updated Vital Signs BP 133/78 (BP Location: Right Arm)   Pulse 78   Temp 98.1 F (36.7 C) (Oral)   Resp 20   Ht 6\' 3"  (1.905 m)   Wt 70.3 kg   SpO2 97%   BMI 19.37 kg/m   Physical Exam Vitals signs and nursing note reviewed.    28 year old male, resting comfortably and in no acute distress. Vital signs are normal. Oxygen saturation is 97%, which is normal. Head is normocephalic and  atraumatic. PERRLA, EOMI. Oropharynx is clear. Neck is nontender and supple without adenopathy or JVD. Back is nontender and there is no CVA tenderness. Lungs are clear without rales, wheezes, or rhonchi. Chest is nontender. Heart has regular rate and rhythm without murmur. Abdomen is soft, flat, nontender without masses or hepatosplenomegaly and peristalsis is normoactive. Extremities: No obvious swelling to the left hand and wrist.  There is tenderness diffusely throughout the left wrist and over the left third MCP joint.  Neurovascular exam is intact. Skin is warm and dry without rash. Neurologic: Mental status is normal, cranial nerves are intact, there are no motor or sensory deficits.  ED Treatments / Results   Radiology Dg Hand Complete Left  Result Date: 07/31/2019 CLINICAL DATA:  Hit hand on post. EXAM: LEFT HAND - COMPLETE 3+ VIEW COMPARISON:  None. FINDINGS: Irregularity at the base of the left 5th metacarpal. This could reflect a small avulsion fracture. No subluxation or dislocation. IMPRESSION: Irregularity in the base of the left 5th metacarpal, possibly small avulsion fracture. Electronically Signed   By: Charlett NoseKevin  Dover M.D.   On: 07/31/2019 01:16    Procedures Procedures   Medications Ordered in ED Medications - No data to display   Initial  Impression / Assessment and Plan / ED Course  I have reviewed the triage vital signs and the nursing notes.  Pertinent imaging results that were available during my care of the patient were reviewed by me and considered in my medical decision making (see chart for details).  Injury to her left hand and wrist.  X-rays show questionable avulsion fracture of the base of the fifth metacarpal, but he does not have point tenderness over that area.  He is placed in a wrist splint and and referred to hand surgery for follow-up.  Final Clinical Impressions(s) / ED Diagnoses   Final diagnoses:  Contusion of left hand, initial encounter   Sprain of left wrist, initial encounter    ED Discharge Orders    None       Delora Fuel, MD 23/55/73 0127

## 2019-07-31 NOTE — Discharge Instructions (Addendum)
Wear the splint until you see the hand specialist.  Apply ice for thirty minutes at a time, four times a day.  Take acetaminophen and ibuprofen as needed for pain.

## 2019-07-31 NOTE — ED Triage Notes (Signed)
Pt was punching a punching bag and missed the bag, hit left hand on wooden post. This happened approx. 2200 last night

## 2019-11-09 ENCOUNTER — Emergency Department (HOSPITAL_COMMUNITY)
Admission: EM | Admit: 2019-11-09 | Discharge: 2019-11-09 | Disposition: A | Discharge status: Home / Self Care | Payer: Self-pay | Attending: Emergency Medicine | Admitting: Emergency Medicine

## 2019-11-09 ENCOUNTER — Encounter (HOSPITAL_COMMUNITY): Payer: Self-pay

## 2019-11-09 ENCOUNTER — Other Ambulatory Visit: Payer: Self-pay

## 2019-11-09 DIAGNOSIS — Z789 Other specified health status: Secondary | ICD-10-CM

## 2019-11-09 DIAGNOSIS — R112 Nausea with vomiting, unspecified: Secondary | ICD-10-CM

## 2019-11-09 DIAGNOSIS — K2900 Acute gastritis without bleeding: Secondary | ICD-10-CM | POA: Insufficient documentation

## 2019-11-09 DIAGNOSIS — F17210 Nicotine dependence, cigarettes, uncomplicated: Secondary | ICD-10-CM | POA: Insufficient documentation

## 2019-11-09 DIAGNOSIS — Z7289 Other problems related to lifestyle: Secondary | ICD-10-CM

## 2019-11-09 DIAGNOSIS — F10929 Alcohol use, unspecified with intoxication, unspecified: Secondary | ICD-10-CM | POA: Insufficient documentation

## 2019-11-09 LAB — CBC WITH DIFFERENTIAL/PLATELET
Abs Immature Granulocytes: 0.07 10*3/uL (ref 0.00–0.07)
Basophils Absolute: 0 10*3/uL (ref 0.0–0.1)
Basophils Relative: 0 %
Eosinophils Absolute: 0 10*3/uL (ref 0.0–0.5)
Eosinophils Relative: 0 %
HCT: 44.6 % (ref 39.0–52.0)
Hemoglobin: 14.9 g/dL (ref 13.0–17.0)
Immature Granulocytes: 0 %
Lymphocytes Relative: 10 %
Lymphs Abs: 1.5 10*3/uL (ref 0.7–4.0)
MCH: 32 pg (ref 26.0–34.0)
MCHC: 33.4 g/dL (ref 30.0–36.0)
MCV: 95.9 fL (ref 80.0–100.0)
Monocytes Absolute: 0.8 10*3/uL (ref 0.1–1.0)
Monocytes Relative: 5 %
Neutro Abs: 13.5 10*3/uL — ABNORMAL HIGH (ref 1.7–7.7)
Neutrophils Relative %: 85 %
Platelets: 219 10*3/uL (ref 150–400)
RBC: 4.65 MIL/uL (ref 4.22–5.81)
RDW: 12.5 % (ref 11.5–15.5)
WBC: 15.9 10*3/uL — ABNORMAL HIGH (ref 4.0–10.5)
nRBC: 0 % (ref 0.0–0.2)

## 2019-11-09 LAB — LIPASE, BLOOD: Lipase: 20 U/L (ref 11–51)

## 2019-11-09 LAB — COMPREHENSIVE METABOLIC PANEL
ALT: 24 U/L (ref 0–44)
AST: 33 U/L (ref 15–41)
Albumin: 4.3 g/dL (ref 3.5–5.0)
Alkaline Phosphatase: 42 U/L (ref 38–126)
Anion gap: 13 (ref 5–15)
BUN: 12 mg/dL (ref 6–20)
CO2: 23 mmol/L (ref 22–32)
Calcium: 9.2 mg/dL (ref 8.9–10.3)
Chloride: 106 mmol/L (ref 98–111)
Creatinine, Ser: 1.19 mg/dL (ref 0.61–1.24)
GFR calc Af Amer: >60 mL/min (ref >60)
GFR calc non Af Amer: >60 mL/min (ref >60)
Glucose, Bld: 87 mg/dL (ref 70–99)
Potassium: 4.5 mmol/L (ref 3.5–5.1)
Sodium: 142 mmol/L (ref 135–145)
Total Bilirubin: 1.5 mg/dL — ABNORMAL HIGH (ref 0.3–1.2)
Total Protein: 7.2 g/dL (ref 6.5–8.1)

## 2019-11-09 MED ORDER — METOCLOPRAMIDE HCL 5 MG/ML IJ SOLN
10.0000 mg | Freq: Once | INTRAMUSCULAR | Status: AC
  Administered 2019-11-09: 10 mg via INTRAVENOUS
  Filled 2019-11-09: qty 2

## 2019-11-09 MED ORDER — SODIUM CHLORIDE 0.9 % IV BOLUS
1000.0000 mL | Freq: Once | INTRAVENOUS | Status: AC
  Administered 2019-11-09: 1000 mL via INTRAVENOUS

## 2019-11-09 MED ORDER — ONDANSETRON 4 MG PO TBDP: ORAL_TABLET | ORAL | 0 refills | Status: DC

## 2019-11-09 NOTE — ED Provider Notes (Signed)
[facility] EMERGENCY DEPARTMENT Provider Note   CSN: [id number] Arrival date & time: 11/09/19  3329     History   Chief Complaint Chief Complaint  Patient presents with  . Emesis    HPI Edwin Oneill is a 28 y.o. male.     Patient presents with recurrent vomiting and nausea since this morning.  Patient had 9 shots of tequila last evening.  Patient does not drink alcohol regularly.  Patient had diarrhea without blood in the stools and mild epigastric discomfort.  Patient Zofran and 400 mL of fluids on route.  Patient denies significant medical or surgical history.  Patient cigarette smoker.     History reviewed. No pertinent past medical history.  Patient Active Problem List   Diagnosis Date Noted  . KNEE PAIN, RIGHT 10/11/2007    Past Surgical History:  Procedure Laterality Date  . ANKLE SURGERY Right         Home Medications    Prior to Admission medications   Medication Sig Start Date End Date Taking? Authorizing Provider  ondansetron (ZOFRAN ODT) 4 MG disintegrating tablet 4mg  ODT q4 hours prn nausea/vomit 11/09/19   Elnora Morrison, MD    Family History No family history on file.  Social History Social History   Tobacco Use  . Smoking status: Current Every Day Smoker    Packs/day: 1.50    Types: Cigarettes  . Smokeless tobacco: Never Used  Substance Use Topics  . Alcohol use: Yes    Alcohol/week: 9.0 standard drinks    Types: 9 Shots of liquor per week    Comment: occ  . Drug use: No     Allergies   Solbar pf spf15   Review of Systems Review of Systems  Constitutional: Negative for chills and fever.  HENT: Negative for congestion.   Eyes: Negative for visual disturbance.  Respiratory: Negative for shortness of breath.   Cardiovascular: Negative for chest pain.  Gastrointestinal: Positive for abdominal pain, nausea and vomiting.  Genitourinary: Negative for dysuria and flank pain.  Musculoskeletal: Negative for back pain, neck pain  and neck stiffness.  Skin: Negative for rash.  Neurological: Positive for light-headedness. Negative for headaches.     Physical Exam Updated Vital Signs BP (!) 105/57   Pulse 71   Temp 97.8 F (36.6 C) (Oral)   Resp 17   Ht 6\' 3"  (1.905 m)   Wt 68 kg   SpO2 100%   BMI 18.75 kg/m   Physical Exam Vitals signs and nursing note reviewed.  Constitutional:      Appearance: He is well-developed.  HENT:     Head: Normocephalic and atraumatic.     Comments: dry Eyes:     General:        Right eye: No discharge.        Left eye: No discharge.     Conjunctiva/sclera: Conjunctivae normal.  Neck:     Musculoskeletal: Normal range of motion and neck supple.     Trachea: No tracheal deviation.  Cardiovascular:     Rate and Rhythm: Regular rhythm. Bradycardia present.  Pulmonary:     Effort: Pulmonary effort is normal.     Breath sounds: Normal breath sounds.  Abdominal:     General: There is no distension.     Palpations: Abdomen is soft.     Tenderness: There is abdominal tenderness (mild epig). There is no guarding.  Skin:    General: Skin is warm.     Findings: No rash.  Neurological:     Mental Status: He is alert and oriented to person, place, and time.      ED Treatments / Results  Labs (all labs ordered are listed, but only abnormal results are displayed) Labs Reviewed  COMPREHENSIVE METABOLIC PANEL - Abnormal; Notable for the following components:      Result Value   Total Bilirubin 1.5 (*)    All other components within normal limits  CBC WITH DIFFERENTIAL/PLATELET - Abnormal; Notable for the following components:   WBC 15.9 (*)    Neutro Abs 13.5 (*)    All other components within normal limits  LIPASE, BLOOD    EKG EKG Interpretation  Date/Time:  Saturday November 09 2019 09:24:30 EST Ventricular Rate:  47 PR Interval:    QRS Duration: 92 QT Interval:  441 QTC Calculation: 390 R Axis:   79 Text Interpretation: Sinus bradycardia Confirmed by  Blane Ohara [phone]) on 11/09/2019 10:23:01 AM   Radiology No results found.  Procedures Procedures (including critical care time)  Medications Ordered in ED Medications  sodium chloride 0.9 % bolus 1,000 mL (1,000 mLs Intravenous New Bag/Given 11/09/19 0950)  metoCLOPramide (REGLAN) injection 10 mg (10 mg Intravenous Given 11/09/19 0950)     Initial Impression / Assessment and Plan / ED Course  I have reviewed the triage vital signs and the nursing notes.  Pertinent labs & imaging results that were available during my care of the patient were reviewed by me and considered in my medical decision making (see chart for details).       Patient presents with clinically acute gastritis from alcohol use and clinically dehydrated.  Plan for IV fluids, antiemetics, screening blood work discussed outpatient follow-up.  Patient improved on reassessment requesting to go home.  Blood work reviewed mild leukocytosis 15.9 likely from vomiting.  Patient has no right lower quadrant tenderness.  Remainder blood work reassuring normal lipase.  Patient stable for outpatient follow-up.  Final Clinical Impressions(s) / ED Diagnoses   Final diagnoses:  Nausea vomiting and diarrhea  Alcohol use  Acute gastritis without hemorrhage, unspecified gastritis type    ED Discharge Orders         Ordered    ondansetron (ZOFRAN ODT) 4 MG disintegrating tablet     11/09/19 1038           Blane Ohara, MD 11/09/19 1040

## 2019-11-09 NOTE — ED Triage Notes (Signed)
Pt had excessive amt of alcohol intake approx 9 shots of tequila. Pt started vomiting at 3 am. Greater than 9 times. Diarrhea and abdominal.Pt given zofran 4 mg IV and 400 ml of fluid

## 2019-11-09 NOTE — Discharge Instructions (Signed)
Use Zofran as needed for nausea and vomiting. Return for fevers, uncontrolled pain, if you pass out or new concerns.

## 2020-06-27 ENCOUNTER — Emergency Department (HOSPITAL_COMMUNITY)
Admission: EM | Admit: 2020-06-27 | Discharge: 2020-06-27 | Disposition: A | Discharge status: Home / Self Care | Payer: Self-pay | Attending: Emergency Medicine | Admitting: Emergency Medicine

## 2020-06-27 ENCOUNTER — Other Ambulatory Visit: Payer: Self-pay

## 2020-06-27 ENCOUNTER — Encounter (HOSPITAL_COMMUNITY): Payer: Self-pay | Admitting: Emergency Medicine

## 2020-06-27 DIAGNOSIS — R112 Nausea with vomiting, unspecified: Secondary | ICD-10-CM | POA: Insufficient documentation

## 2020-06-27 DIAGNOSIS — F17210 Nicotine dependence, cigarettes, uncomplicated: Secondary | ICD-10-CM | POA: Insufficient documentation

## 2020-06-27 LAB — URINALYSIS, ROUTINE W REFLEX MICROSCOPIC
Bilirubin Urine: NEGATIVE
Glucose, UA: NEGATIVE mg/dL
Hgb urine dipstick: NEGATIVE
Ketones, ur: NEGATIVE mg/dL
Leukocytes,Ua: NEGATIVE
Nitrite: NEGATIVE
Protein, ur: NEGATIVE mg/dL
Specific Gravity, Urine: 1.019 (ref 1.005–1.030)
pH: 7 (ref 5.0–8.0)

## 2020-06-27 LAB — CBC
HCT: 41.1 % (ref 39.0–52.0)
Hemoglobin: 13.8 g/dL (ref 13.0–17.0)
MCH: 32.1 pg (ref 26.0–34.0)
MCHC: 33.6 g/dL (ref 30.0–36.0)
MCV: 95.6 fL (ref 80.0–100.0)
Platelets: 205 10*3/uL (ref 150–400)
RBC: 4.3 MIL/uL (ref 4.22–5.81)
RDW: 12.5 % (ref 11.5–15.5)
WBC: 14.7 10*3/uL — ABNORMAL HIGH (ref 4.0–10.5)
nRBC: 0 % (ref 0.0–0.2)

## 2020-06-27 LAB — COMPREHENSIVE METABOLIC PANEL
ALT: 19 U/L (ref 0–44)
AST: 23 U/L (ref 15–41)
Albumin: 4 g/dL (ref 3.5–5.0)
Alkaline Phosphatase: 41 U/L (ref 38–126)
Anion gap: 9 (ref 5–15)
BUN: 10 mg/dL (ref 6–20)
CO2: 25 mmol/L (ref 22–32)
Calcium: 8.8 mg/dL — ABNORMAL LOW (ref 8.9–10.3)
Chloride: 108 mmol/L (ref 98–111)
Creatinine, Ser: 1.08 mg/dL (ref 0.61–1.24)
GFR calc Af Amer: >60 mL/min (ref >60)
GFR calc non Af Amer: >60 mL/min (ref >60)
Glucose, Bld: 97 mg/dL (ref 70–99)
Potassium: 3.9 mmol/L (ref 3.5–5.1)
Sodium: 142 mmol/L (ref 135–145)
Total Bilirubin: 1 mg/dL (ref 0.3–1.2)
Total Protein: 6.6 g/dL (ref 6.5–8.1)

## 2020-06-27 LAB — LIPASE, BLOOD: Lipase: 20 U/L (ref 11–51)

## 2020-06-27 MED ORDER — METOCLOPRAMIDE HCL 5 MG/ML IJ SOLN
10.0000 mg | Freq: Once | INTRAMUSCULAR | Status: AC
  Administered 2020-06-27: 10 mg via INTRAVENOUS
  Filled 2020-06-27: qty 2

## 2020-06-27 MED ORDER — SODIUM CHLORIDE 0.9 % IV BOLUS
1000.0000 mL | Freq: Once | INTRAVENOUS | Status: AC
  Administered 2020-06-27: 1000 mL via INTRAVENOUS

## 2020-06-27 MED ORDER — ONDANSETRON HCL 4 MG/2ML IJ SOLN
4.0000 mg | Freq: Once | INTRAMUSCULAR | Status: AC | PRN
  Administered 2020-06-27: 4 mg via INTRAVENOUS
  Filled 2020-06-27: qty 2

## 2020-06-27 NOTE — ED Triage Notes (Signed)
Drank tequila and ate beef stick last night   Vomiting since 0500   Unrelenting  Several liters of fluid by [place]

## 2020-06-27 NOTE — Discharge Instructions (Addendum)
Small, frequent sips of fluids today.  Bland diet as tolerated.  Return to the emergency department if you develop any worsening symptoms such as abdominal pain, persistent vomiting or fever.

## 2020-07-01 NOTE — ED Provider Notes (Signed)
[facility] EMERGENCY DEPARTMENT Provider Note   CSN: [id number] Arrival date & time: 06/27/20  1052     History Chief Complaint  Patient presents with  . Emesis    Edwin Oneill is a 29 y.o. male.  HPI      Edwin Oneill is a 29 y.o. male who presents to the Emergency Department complaining of nausea and vomiting since 5 AM on the morning of arrival.  He reports having multiple episodes of vomiting and unable to keep down any food or fluids.  He states that he ate a beef snack and drank some tequila prior to onset of his symptoms.  He denies any excessive alcohol consumption, illicit drugs, abdominal pain, back pain, flank pain, fever or chills.  Nothing makes his symptoms better or worse.    History reviewed. No pertinent past medical history.  Patient Active Problem List   Diagnosis Date Noted  . KNEE PAIN, RIGHT 10/11/2007    Past Surgical History:  Procedure Laterality Date  . ANKLE SURGERY Right        History reviewed. No pertinent family history.  Social History   Tobacco Use  . Smoking status: Current Every Day Smoker    Packs/day: 1.50    Types: Cigarettes  . Smokeless tobacco: Never Used  Substance Use Topics  . Alcohol use: Yes    Alcohol/week: 9.0 standard drinks    Types: 9 Shots of liquor per week    Comment: occ  . Drug use: No    Home Medications Prior to Admission medications   Medication Sig Start Date End Date Taking? Authorizing Provider  ondansetron (ZOFRAN ODT) 4 MG disintegrating tablet 4mg  ODT q4 hours prn nausea/vomit Patient not taking: Reported on 06/27/2020 11/09/19   11/11/19, MD    Allergies    Solbar pf spf15  Review of Systems   Review of Systems  Constitutional: Negative for chills, fatigue and fever.  HENT: Negative for sore throat and trouble swallowing.   Respiratory: Negative for shortness of breath and wheezing.   Cardiovascular: Negative for chest pain and palpitations.  Gastrointestinal: Positive  for nausea and vomiting. Negative for abdominal distention, abdominal pain, blood in stool and diarrhea.  Genitourinary: Negative for dysuria and flank pain.  Musculoskeletal: Negative for arthralgias, back pain, myalgias, neck pain and neck stiffness.  Skin: Negative for rash.  Neurological: Negative for dizziness, syncope, weakness and numbness.  Hematological: Does not bruise/bleed easily.  Psychiatric/Behavioral: Negative for confusion.    Physical Exam Updated Vital Signs BP 110/60   Pulse 60   Temp 98.1 F (36.7 C) (Oral)   Resp 18   Ht 6\' 3"  (1.905 m)   Wt 68 kg   SpO2 99%   BMI 18.75 kg/m   Physical Exam Vitals and nursing note reviewed.  Constitutional:      Appearance: Normal appearance. He is not ill-appearing or toxic-appearing.  HENT:     Head: Normocephalic.     Mouth/Throat:     Mouth: Mucous membranes are dry.  Eyes:     Extraocular Movements: Extraocular movements intact.     Conjunctiva/sclera: Conjunctivae normal.     Pupils: Pupils are equal, round, and reactive to light.  Neck:     Thyroid: No thyromegaly.     Meningeal: Kernig's sign absent.  Cardiovascular:     Rate and Rhythm: Normal rate and regular rhythm.     Pulses: Normal pulses.  Pulmonary:     Effort: Pulmonary effort is normal.  Breath sounds: Normal breath sounds. No wheezing.  Abdominal:     General: There is no distension.     Palpations: Abdomen is soft.     Tenderness: There is no abdominal tenderness. There is no guarding or rebound.  Musculoskeletal:        General: Normal range of motion.     Cervical back: Normal range of motion and neck supple.  Skin:    General: Skin is warm.     Capillary Refill: Capillary refill takes less than 2 seconds.     Findings: No rash.  Neurological:     General: No focal deficit present.     Mental Status: He is alert.     Sensory: No sensory deficit.     Motor: No weakness.     ED Results / Procedures / Treatments   Labs (all  labs ordered are listed, but only abnormal results are displayed) Labs Reviewed  COMPREHENSIVE METABOLIC PANEL - Abnormal; Notable for the following components:      Result Value   Calcium 8.8 (*)    All other components within normal limits  CBC - Abnormal; Notable for the following components:   WBC 14.7 (*)    All other components within normal limits  URINALYSIS, ROUTINE W REFLEX MICROSCOPIC - Abnormal; Notable for the following components:   Bacteria, UA FEW (*)    All other components within normal limits  LIPASE, BLOOD    EKG EKG Interpretation  Date/Time:  Saturday June 27 2020 11:17:29 EDT Ventricular Rate:  54 PR Interval:    QRS Duration: 98 QT Interval:  470 QTC Calculation: 446 R Axis:   77 Text Interpretation: Sinus rhythm Atrial premature complexes in couplets No significant change since prior 11/20 Confirmed by Meridee Score [phone]) on 06/28/2020 11:10:40 AM   Radiology No results found.  Procedures Procedures (including critical care time)  Medications Ordered in ED Medications  ondansetron (ZOFRAN) injection 4 mg (4 mg Intravenous Given 06/27/20 1116)  sodium chloride 0.9 % bolus 1,000 mL (0 mLs Intravenous Stopped 06/27/20 1505)  metoCLOPramide (REGLAN) injection 10 mg (10 mg Intravenous Given 06/27/20 1418)    ED Course  I have reviewed the triage vital signs and the nursing notes.  Pertinent labs & imaging results that were available during my care of the patient were reviewed by me and considered in my medical decision making (see chart for details).    MDM Rules/Calculators/A&P                          Patient here with reported multiple episodes of vomiting after drinking tequila and eating a beef snack.  No abdominal pain on exam.  No peritoneal signs.  Patient is well-appearing.  Mucous membranes are slightly dry.  Patient given antiemetic and IV normal saline.  On recheck, patient reports feeling better, no further vomiting.  He has tolerated  oral fluid challenge well.  Labs unremarkable.  Doubt acute abdomen.  Feel patient is appropriate for discharge home agrees to bland diet and oral hydration instructions.  Final Clinical Impression(s) / ED Diagnoses Final diagnoses:  Non-intractable vomiting with nausea, unspecified vomiting type    Rx / DC Orders ED Discharge Orders    None       Pauline Aus, PA-C 07/01/20 1430    Bethann Berkshire, MD 07/02/20 1017

## 2020-07-09 IMAGING — CR LEFT HAND - COMPLETE 3+ VIEW
3 series · 3 of 3 positions shown · non-contrast
Comparison: None.

CLINICAL DATA: Hit hand on post.

EXAM:
LEFT HAND - COMPLETE 3+ VIEW

[pa]
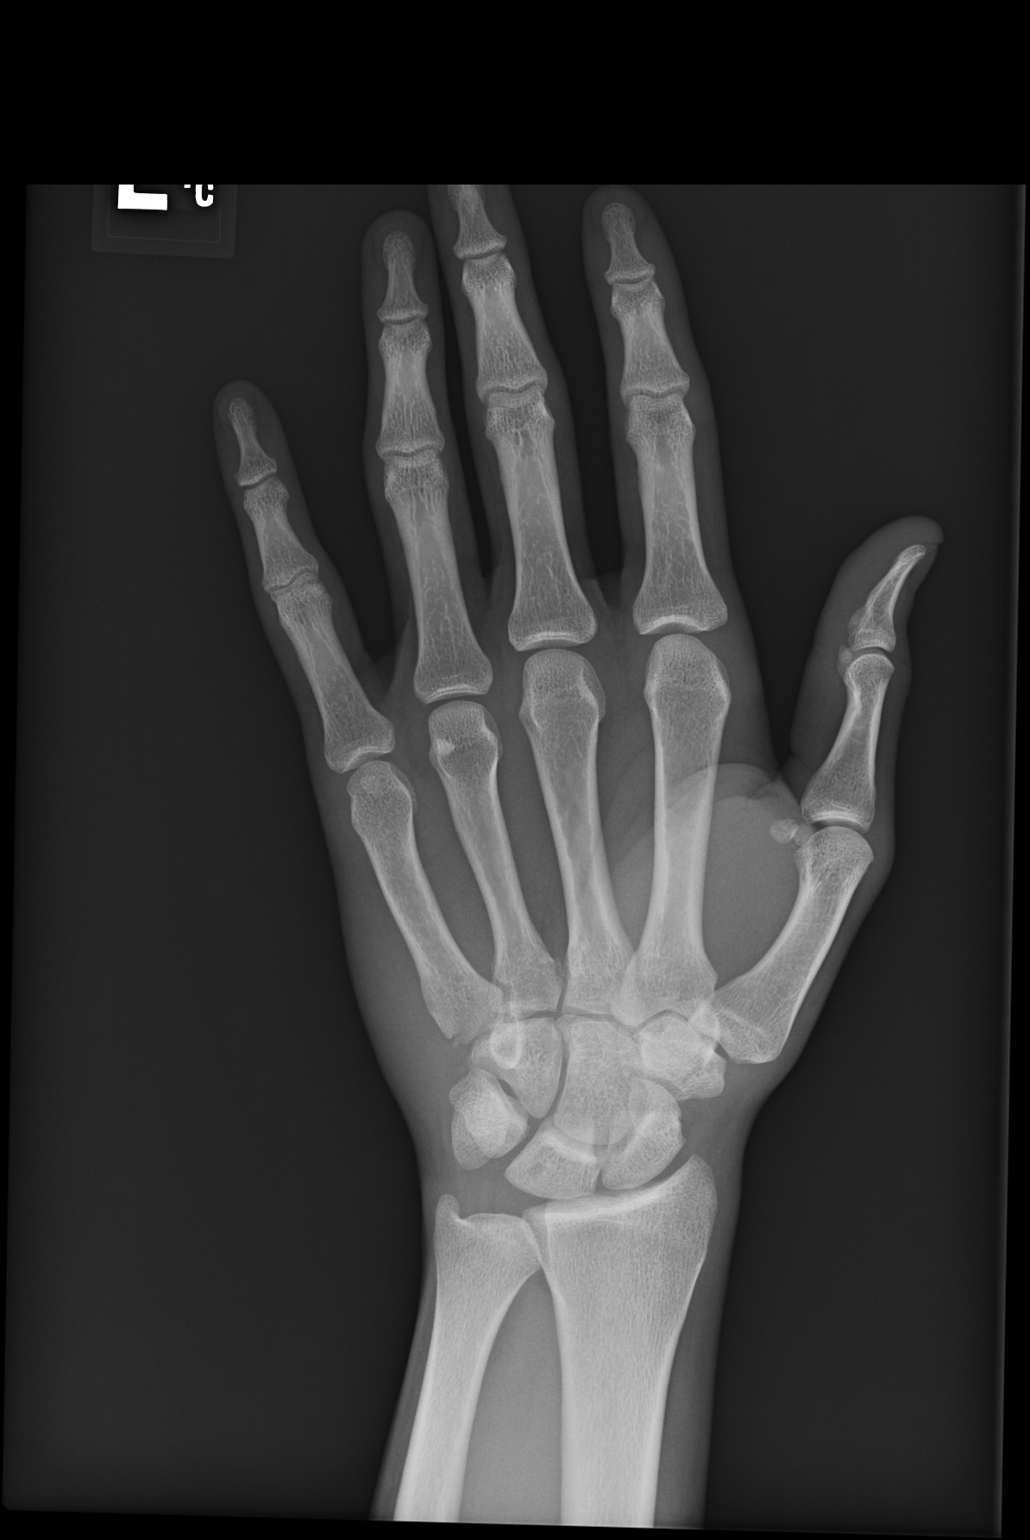

[oblique]
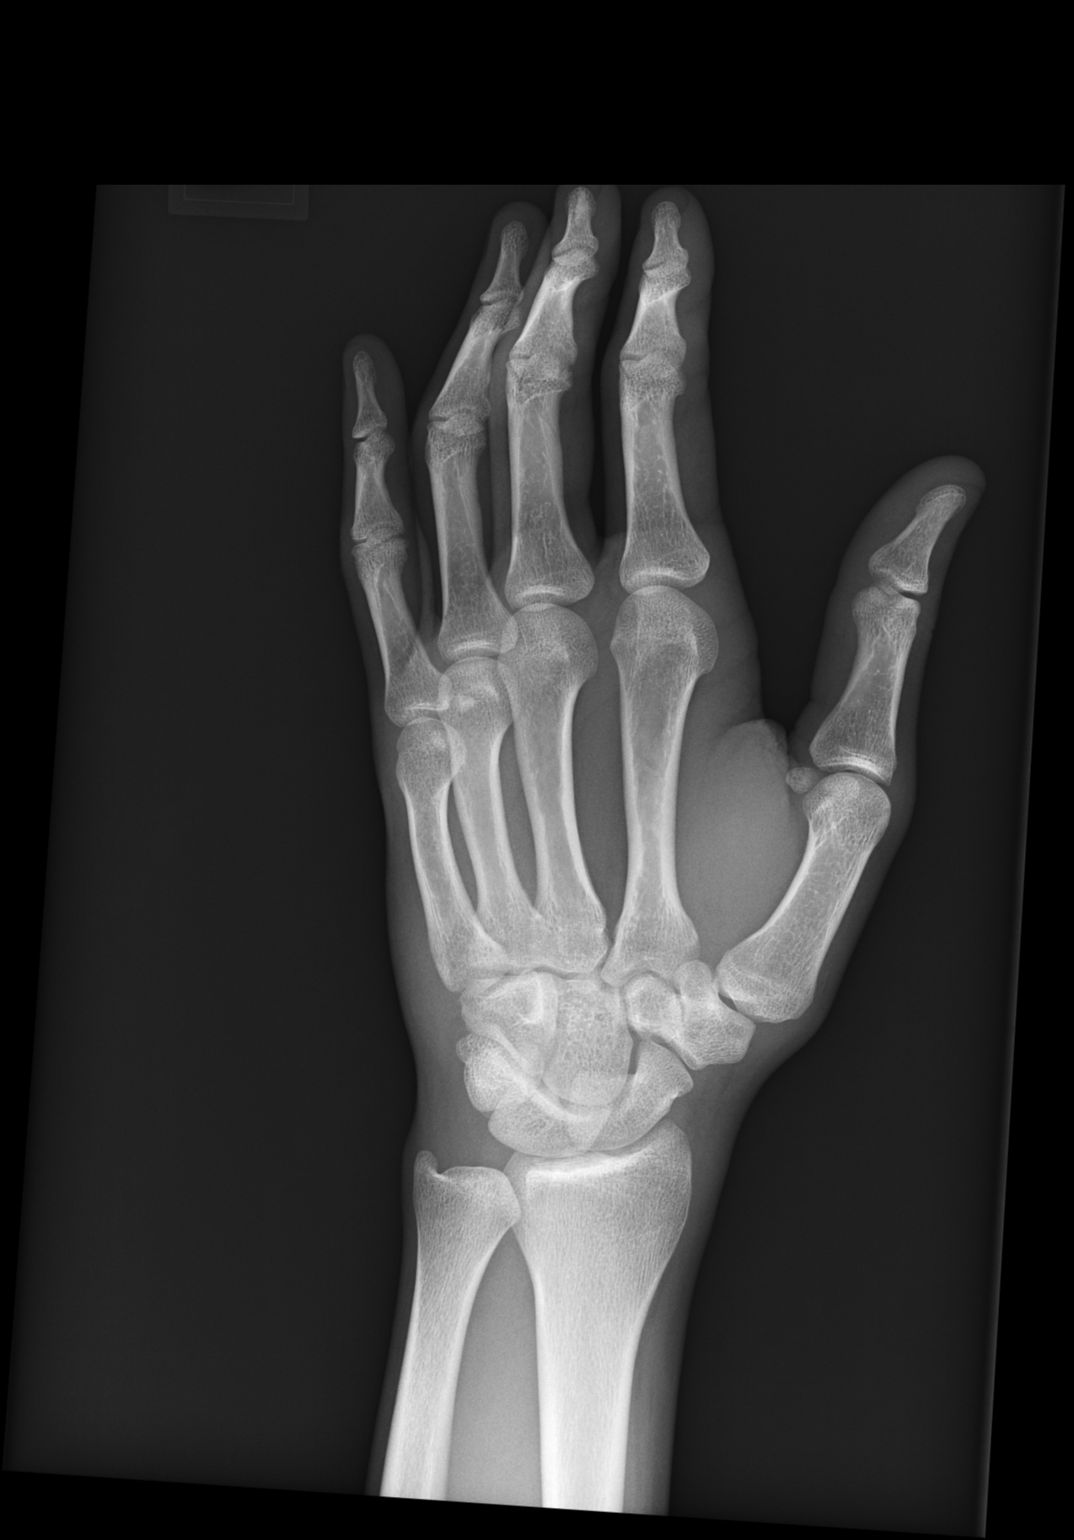

[lat]
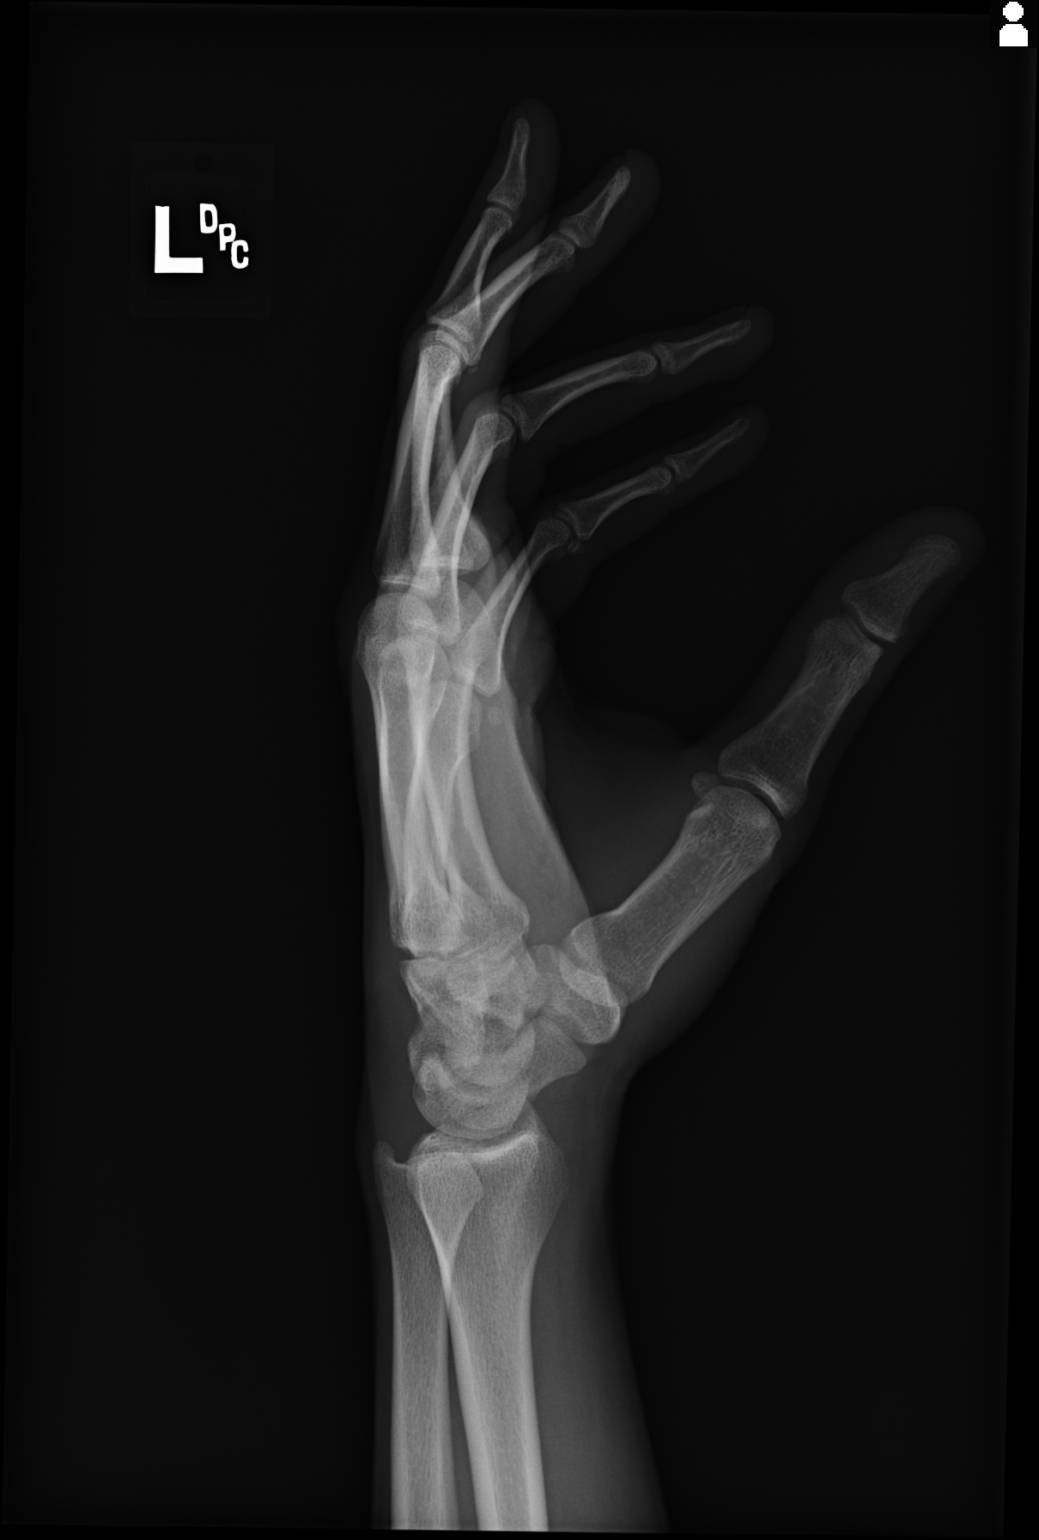

[3 of 3 positions shown; findings below may reference images not displayed]

FINDINGS: Irregularity at the base of the left 5th metacarpal. This could
reflect a small avulsion fracture. No subluxation or dislocation.
IMPRESSION: Irregularity in the base of the left 5th metacarpal, possibly small
avulsion fracture.

## 2020-10-03 ENCOUNTER — Other Ambulatory Visit: Payer: Self-pay

## 2020-10-03 ENCOUNTER — Emergency Department (HOSPITAL_COMMUNITY)
Admission: EM | Admit: 2020-10-03 | Discharge: 2020-10-03 | Disposition: A | Discharge status: Home / Self Care | Payer: Self-pay | Attending: Emergency Medicine | Admitting: Emergency Medicine

## 2020-10-03 ENCOUNTER — Encounter (HOSPITAL_COMMUNITY): Payer: Self-pay

## 2020-10-03 ENCOUNTER — Emergency Department (HOSPITAL_COMMUNITY): Payer: Self-pay

## 2020-10-03 DIAGNOSIS — Z20822 Contact with and (suspected) exposure to covid-19: Secondary | ICD-10-CM | POA: Insufficient documentation

## 2020-10-03 DIAGNOSIS — R197 Diarrhea, unspecified: Secondary | ICD-10-CM | POA: Insufficient documentation

## 2020-10-03 DIAGNOSIS — F17210 Nicotine dependence, cigarettes, uncomplicated: Secondary | ICD-10-CM | POA: Insufficient documentation

## 2020-10-03 DIAGNOSIS — R112 Nausea with vomiting, unspecified: Secondary | ICD-10-CM | POA: Insufficient documentation

## 2020-10-03 DIAGNOSIS — R1084 Generalized abdominal pain: Secondary | ICD-10-CM | POA: Insufficient documentation

## 2020-10-03 LAB — COMPREHENSIVE METABOLIC PANEL
ALT: 27 U/L (ref 0–44)
AST: 38 U/L (ref 15–41)
Albumin: 4.7 g/dL (ref 3.5–5.0)
Alkaline Phosphatase: 42 U/L (ref 38–126)
Anion gap: 13 (ref 5–15)
BUN: 10 mg/dL (ref 6–20)
CO2: 24 mmol/L (ref 22–32)
Calcium: 9.8 mg/dL (ref 8.9–10.3)
Chloride: 100 mmol/L (ref 98–111)
Creatinine, Ser: 1.21 mg/dL (ref 0.61–1.24)
GFR, Estimated: >60 mL/min (ref >60)
Glucose, Bld: 130 mg/dL — ABNORMAL HIGH (ref 70–99)
Potassium: 4 mmol/L (ref 3.5–5.1)
Sodium: 137 mmol/L (ref 135–145)
Total Bilirubin: 0.9 mg/dL (ref 0.3–1.2)
Total Protein: 8.1 g/dL (ref 6.5–8.1)

## 2020-10-03 LAB — URINALYSIS, ROUTINE W REFLEX MICROSCOPIC
Bilirubin Urine: NEGATIVE
Glucose, UA: NEGATIVE mg/dL
Hgb urine dipstick: NEGATIVE
Ketones, ur: NEGATIVE mg/dL
Leukocytes,Ua: NEGATIVE
Nitrite: NEGATIVE
Protein, ur: NEGATIVE mg/dL
Specific Gravity, Urine: >1.046 — ABNORMAL HIGH (ref 1.005–1.030)
pH: 7 (ref 5.0–8.0)

## 2020-10-03 LAB — RESPIRATORY PANEL BY RT PCR (FLU A&B, COVID)
Influenza A by PCR: NEGATIVE
Influenza B by PCR: NEGATIVE
SARS Coronavirus 2 by RT PCR: NEGATIVE

## 2020-10-03 LAB — CBC
HCT: 45.1 % (ref 39.0–52.0)
Hemoglobin: 15.6 g/dL (ref 13.0–17.0)
MCH: 32.4 pg (ref 26.0–34.0)
MCHC: 34.6 g/dL (ref 30.0–36.0)
MCV: 93.8 fL (ref 80.0–100.0)
Platelets: 253 10*3/uL (ref 150–400)
RBC: 4.81 MIL/uL (ref 4.22–5.81)
RDW: 12.6 % (ref 11.5–15.5)
WBC: 15.2 10*3/uL — ABNORMAL HIGH (ref 4.0–10.5)
nRBC: 0 % (ref 0.0–0.2)

## 2020-10-03 LAB — LIPASE, BLOOD: Lipase: 25 U/L (ref 11–51)

## 2020-10-03 MED ORDER — ONDANSETRON HCL 4 MG/2ML IJ SOLN
4.0000 mg | Freq: Once | INTRAMUSCULAR | Status: AC
  Administered 2020-10-03: 4 mg via INTRAVENOUS
  Filled 2020-10-03: qty 2

## 2020-10-03 MED ORDER — SODIUM CHLORIDE 0.9 % IV BOLUS
1000.0000 mL | Freq: Once | INTRAVENOUS | Status: AC
  Administered 2020-10-03: 1000 mL via INTRAVENOUS

## 2020-10-03 MED ORDER — HYDROMORPHONE HCL 1 MG/ML IJ SOLN
1.0000 mg | Freq: Once | INTRAMUSCULAR | Status: AC
  Administered 2020-10-03: 1 mg via INTRAVENOUS
  Filled 2020-10-03 (×2): qty 1

## 2020-10-03 MED ORDER — SODIUM CHLORIDE 0.9 % IV SOLN: Freq: Once | INTRAVENOUS | Status: AC

## 2020-10-03 MED ORDER — ONDANSETRON HCL 4 MG/2ML IJ SOLN
4.0000 mg | Freq: Once | INTRAMUSCULAR | Status: AC | PRN
  Administered 2020-10-03: 4 mg via INTRAVENOUS
  Filled 2020-10-03: qty 2

## 2020-10-03 MED ORDER — ONDANSETRON 4 MG PO TBDP: ORAL_TABLET | ORAL | 0 refills | Status: DC

## 2020-10-03 MED ORDER — IOHEXOL 300 MG/ML  SOLN
100.0000 mL | Freq: Once | INTRAMUSCULAR | Status: AC | PRN
  Administered 2020-10-03: 100 mL via INTRAVENOUS

## 2020-10-03 MED ORDER — DICYCLOMINE HCL 20 MG PO TABS: ORAL_TABLET | ORAL | 0 refills | Status: DC

## 2020-10-03 NOTE — ED Notes (Signed)
Pt says nausea improved a little after first dose of zofran.  Another dose given as ordered.

## 2020-10-03 NOTE — ED Notes (Signed)
urine spec to lab

## 2020-10-03 NOTE — ED Provider Notes (Signed)
[facility] EMERGENCY DEPARTMENT Provider Note   CSN: [id number] Arrival date & time: 10/03/20  0756     History Chief Complaint  Patient presents with  . Abdominal Pain    Edwin Oneill is a 29 y.o. male.  Patient with vomiting and diarrhea since this morning.  No blood in his diarrhea.  Patient also with abdominal cramping  The history is provided by the patient and medical records. No language interpreter was used.  Abdominal Pain Pain location:  Generalized Pain quality: aching   Pain radiates to:  Does not radiate Pain severity:  Moderate Onset quality:  Sudden Timing:  Constant Progression:  Worsening Chronicity:  New Context: not alcohol use   Associated symptoms: diarrhea and vomiting   Associated symptoms: no chest pain, no cough, no fatigue and no hematuria        History reviewed. No pertinent past medical history.  Patient Active Problem List   Diagnosis Date Noted  . KNEE PAIN, RIGHT 10/11/2007    Past Surgical History:  Procedure Laterality Date  . ANKLE SURGERY Right        No family history on file.  Social History   Tobacco Use  . Smoking status: Current Every Day Smoker    Packs/day: 1.50    Types: Cigarettes  . Smokeless tobacco: Never Used  Substance Use Topics  . Alcohol use: Yes    Alcohol/week: 9.0 standard drinks    Types: 9 Shots of liquor per week    Comment: occ  . Drug use: No    Home Medications Prior to Admission medications   Medication Sig Start Date End Date Taking? Authorizing Provider  dicyclomine (BENTYL) 20 MG tablet Take one every 8 hours as needed for abdominal pain 10/03/20   Bethann Berkshire, MD  ondansetron (ZOFRAN ODT) 4 MG disintegrating tablet 4mg  ODT q4 hours prn nausea/vomit 10/03/20   12/03/20, MD    Allergies    Solbar pf spf15  Review of Systems   Review of Systems  Constitutional: Negative for appetite change and fatigue.  HENT: Negative for congestion, ear discharge and sinus  pressure.   Eyes: Negative for discharge.  Respiratory: Negative for cough.   Cardiovascular: Negative for chest pain.  Gastrointestinal: Positive for abdominal pain, diarrhea and vomiting.  Genitourinary: Negative for frequency and hematuria.  Musculoskeletal: Negative for back pain.  Skin: Negative for rash.  Neurological: Negative for seizures and headaches.  Psychiatric/Behavioral: Negative for hallucinations.    Physical Exam Updated Vital Signs BP (!) 95/58 (BP Location: Right Arm)   Pulse (!) 47   Temp 98.6 F (37 C) (Oral)   Resp 16   Ht 6\' 3"  (1.905 m)   Wt 68 kg   SpO2 99%   BMI 18.75 kg/m   Physical Exam Vitals and nursing note reviewed.  Constitutional:      Appearance: He is well-developed.  HENT:     Head: Normocephalic.     Nose: Nose normal.  Eyes:     General: No scleral icterus.    Conjunctiva/sclera: Conjunctivae normal.  Neck:     Thyroid: No thyromegaly.  Cardiovascular:     Rate and Rhythm: Normal rate and regular rhythm.     Heart sounds: No murmur heard.  No friction rub. No gallop.   Pulmonary:     Breath sounds: No stridor. No wheezing or rales.  Chest:     Chest wall: No tenderness.  Abdominal:     General: There is no distension.  Tenderness: There is abdominal tenderness. There is no rebound.  Musculoskeletal:        General: Normal range of motion.     Cervical back: Neck supple.  Lymphadenopathy:     Cervical: No cervical adenopathy.  Skin:    Findings: No erythema or rash.  Neurological:     Mental Status: He is alert and oriented to person, place, and time.     Motor: No abnormal muscle tone.     Coordination: Coordination normal.  Psychiatric:        Behavior: Behavior normal.     ED Results / Procedures / Treatments   Labs (all labs ordered are listed, but only abnormal results are displayed) Labs Reviewed  COMPREHENSIVE METABOLIC PANEL - Abnormal; Notable for the following components:      Result Value    Glucose, Bld 130 (*)    All other components within normal limits  CBC - Abnormal; Notable for the following components:   WBC 15.2 (*)    All other components within normal limits  URINALYSIS, ROUTINE W REFLEX MICROSCOPIC - Abnormal; Notable for the following components:   Specific Gravity, Urine >1.046 (*)    All other components within normal limits  RESPIRATORY PANEL BY RT PCR (FLU A&B, COVID)  LIPASE, BLOOD    EKG None  Radiology CT ABDOMEN PELVIS W CONTRAST  Result Date: 10/03/2020 CLINICAL DATA:  Right lower quadrant abdomen pain starting yesterday. EXAM: CT ABDOMEN AND PELVIS WITH CONTRAST TECHNIQUE: Multidetector CT imaging of the abdomen and pelvis was performed using the standard protocol following bolus administration of intravenous contrast. CONTRAST:  80 mL OMNIPAQUE IOHEXOL 300 MG/ML  SOLN COMPARISON:  None. FINDINGS: Lower chest: No acute abnormality. Hepatobiliary: No focal liver abnormality is seen. No gallstones, gallbladder wall thickening, or biliary dilatation. Pancreas: Unremarkable. No pancreatic ductal dilatation or surrounding inflammatory changes. Spleen: Normal in size without focal abnormality. Adrenals/Urinary Tract: Adrenal glands are unremarkable. Kidneys are normal, without renal calculi, focal lesion, or hydronephrosis. Bladder is unremarkable. Stomach/Bowel: Stomach is within normal limits. Appendix appears normal. No evidence of bowel wall thickening, distention, or inflammatory changes. Vascular/Lymphatic: No significant vascular findings are present. No enlarged abdominal or pelvic lymph nodes. Reproductive: Prostate is unremarkable. Other: None. Musculoskeletal: No acute or significant osseous findings. IMPRESSION: No acute abnormality identified in the abdomen and pelvis. The appendix is normal. Electronically Signed   By: Sherian Rein M.D.   On: 10/03/2020 10:17    Procedures Procedures (including critical care time)  Medications Ordered in  ED Medications  ondansetron (ZOFRAN) injection 4 mg (4 mg Intravenous Given 10/03/20 0823)  sodium chloride 0.9 % bolus 1,000 mL (0 mLs Intravenous Stopped 10/03/20 1113)  HYDROmorphone (DILAUDID) injection 1 mg (1 mg Intravenous Given 10/03/20 0909)  ondansetron (ZOFRAN) injection 4 mg (4 mg Intravenous Given 10/03/20 0909)  iohexol (OMNIPAQUE) 300 MG/ML solution 100 mL (100 mLs Intravenous Contrast Given 10/03/20 0900)  0.9 %  sodium chloride infusion (0 mLs Intravenous Stopped 10/03/20 1131)  sodium chloride 0.9 % bolus 1,000 mL (1,000 mLs Intravenous New Bag/Given 10/03/20 1235)    ED Course  I have reviewed the triage vital signs and the nursing notes.  Pertinent labs & imaging results that were available during my care of the patient were reviewed by me and considered in my medical decision making (see chart for details).    MDM Rules/Calculators/A&P  Patient with gastroenteritis.  He will be sent home on Zofran and Bentyl     This patient presents to the ED for concern of nausea vomiting diarrhea, this involves an extensive number of treatment options, and is a complaint that carries with it a high risk of complications and morbidity.  The differential diagnosis includes colitis or enteritis   Lab Tests:   I Ordered, reviewed, and interpreted labs, which included CBC and chemistries which show elevated white count  Medicines ordered:  I ordered medication Zofran and normal saline Imaging Studies ordered:   I ordered imaging studies which included CT abdomen  I independently visualized and interpreted imaging which showed unremarkable  Additional history obtained:   Additional history obtained from records  Previous records obtained and reviewed.  Consultations Obtained:     Reevaluation:  After the interventions stated above, I reevaluated the patient and found improved  Critical Interventions:  .   Final Clinical Impression(s) /  ED Diagnoses Final diagnoses:  Nausea vomiting and diarrhea    Rx / DC Orders ED Discharge Orders         Ordered    ondansetron (ZOFRAN ODT) 4 MG disintegrating tablet        10/03/20 1340    dicyclomine (BENTYL) 20 MG tablet        10/03/20 1340           Bethann Berkshire, MD 10/03/20 1712

## 2020-10-03 NOTE — ED Triage Notes (Signed)
Pt c/o n/v/d and lower abd pain since 4am.

## 2020-10-03 NOTE — Discharge Instructions (Addendum)
Drink plenty of fluids.  Take Imodium for diarrhea if necessary.  And follow-up with your family doctor next week

## 2020-10-03 NOTE — ED Notes (Signed)
Pt requesting water to drink.  EDP aware and instructed to wait until CT results are available and ordered another liter of NS.

## 2020-10-03 NOTE — ED Notes (Signed)
Nausea relieved  

## 2020-10-03 NOTE — ED Notes (Signed)
Pt vomiting during triage.

## 2020-12-13 ENCOUNTER — Encounter (HOSPITAL_COMMUNITY): Payer: Self-pay | Admitting: Emergency Medicine

## 2020-12-13 ENCOUNTER — Emergency Department (HOSPITAL_COMMUNITY)
Admission: EM | Admit: 2020-12-13 | Discharge: 2020-12-13 | Disposition: A | Discharge status: Home / Self Care | Payer: Self-pay | Attending: Emergency Medicine | Admitting: Emergency Medicine

## 2020-12-13 ENCOUNTER — Other Ambulatory Visit: Payer: Self-pay

## 2020-12-13 DIAGNOSIS — R1110 Vomiting, unspecified: Secondary | ICD-10-CM | POA: Insufficient documentation

## 2020-12-13 DIAGNOSIS — Z79899 Other long term (current) drug therapy: Secondary | ICD-10-CM | POA: Insufficient documentation

## 2020-12-13 DIAGNOSIS — R1013 Epigastric pain: Secondary | ICD-10-CM

## 2020-12-13 DIAGNOSIS — F1290 Cannabis use, unspecified, uncomplicated: Secondary | ICD-10-CM

## 2020-12-13 DIAGNOSIS — F17210 Nicotine dependence, cigarettes, uncomplicated: Secondary | ICD-10-CM | POA: Insufficient documentation

## 2020-12-13 DIAGNOSIS — R112 Nausea with vomiting, unspecified: Secondary | ICD-10-CM

## 2020-12-13 LAB — URINALYSIS, ROUTINE W REFLEX MICROSCOPIC
Bacteria, UA: NONE SEEN
Bilirubin Urine: NEGATIVE
Glucose, UA: NEGATIVE mg/dL
Hgb urine dipstick: NEGATIVE
Ketones, ur: NEGATIVE mg/dL
Leukocytes,Ua: NEGATIVE
Nitrite: NEGATIVE
Protein, ur: 30 mg/dL — AB
Specific Gravity, Urine: 1.017 (ref 1.005–1.030)
pH: 7 (ref 5.0–8.0)

## 2020-12-13 LAB — COMPREHENSIVE METABOLIC PANEL
ALT: 15 U/L (ref 0–44)
AST: 27 U/L (ref 15–41)
Albumin: 4.4 g/dL (ref 3.5–5.0)
Alkaline Phosphatase: 40 U/L (ref 38–126)
Anion gap: 7 (ref 5–15)
BUN: 8 mg/dL (ref 6–20)
CO2: 27 mmol/L (ref 22–32)
Calcium: 9.3 mg/dL (ref 8.9–10.3)
Chloride: 104 mmol/L (ref 98–111)
Creatinine, Ser: 1.22 mg/dL (ref 0.61–1.24)
GFR, Estimated: >60 mL/min (ref >60)
Glucose, Bld: 101 mg/dL — ABNORMAL HIGH (ref 70–99)
Potassium: 3.9 mmol/L (ref 3.5–5.1)
Sodium: 138 mmol/L (ref 135–145)
Total Bilirubin: 0.9 mg/dL (ref 0.3–1.2)
Total Protein: 7.5 g/dL (ref 6.5–8.1)

## 2020-12-13 LAB — RAPID URINE DRUG SCREEN, HOSP PERFORMED
Amphetamines: NOT DETECTED
Barbiturates: NOT DETECTED
Benzodiazepines: NOT DETECTED
Cocaine: NOT DETECTED
Opiates: NOT DETECTED
Tetrahydrocannabinol: POSITIVE — AB

## 2020-12-13 LAB — CBC
HCT: 41.9 % (ref 39.0–52.0)
Hemoglobin: 14.5 g/dL (ref 13.0–17.0)
MCH: 32.7 pg (ref 26.0–34.0)
MCHC: 34.6 g/dL (ref 30.0–36.0)
MCV: 94.4 fL (ref 80.0–100.0)
Platelets: 244 10*3/uL (ref 150–400)
RBC: 4.44 MIL/uL (ref 4.22–5.81)
RDW: 13.1 % (ref 11.5–15.5)
WBC: 11.7 10*3/uL — ABNORMAL HIGH (ref 4.0–10.5)
nRBC: 0 % (ref 0.0–0.2)

## 2020-12-13 LAB — LIPASE, BLOOD: Lipase: 22 U/L (ref 11–51)

## 2020-12-13 MED ORDER — SODIUM CHLORIDE 0.9 % IV BOLUS
1000.0000 mL | Freq: Once | INTRAVENOUS | Status: AC
  Administered 2020-12-13: 10:00:00 1000 mL via INTRAVENOUS

## 2020-12-13 MED ORDER — LORAZEPAM 2 MG/ML IJ SOLN
1.0000 mg | Freq: Once | INTRAMUSCULAR | Status: AC
  Administered 2020-12-13: 10:00:00 1 mg via INTRAVENOUS
  Filled 2020-12-13: qty 1

## 2020-12-13 MED ORDER — HYDROMORPHONE HCL 1 MG/ML IJ SOLN
0.5000 mg | Freq: Once | INTRAMUSCULAR | Status: AC
  Administered 2020-12-13: 08:00:00 0.5 mg via INTRAVENOUS
  Filled 2020-12-13: qty 1

## 2020-12-13 MED ORDER — FAMOTIDINE IN NACL 20-0.9 MG/50ML-% IV SOLN
20.0000 mg | Freq: Once | INTRAVENOUS | Status: AC
  Administered 2020-12-13: 08:00:00 20 mg via INTRAVENOUS
  Filled 2020-12-13: qty 50

## 2020-12-13 MED ORDER — ONDANSETRON 8 MG PO TBDP: 8.0000 mg | ORAL_TABLET | Freq: Three times a day (TID) | ORAL | 0 refills | Status: AC | PRN

## 2020-12-13 MED ORDER — ONDANSETRON HCL 4 MG/2ML IJ SOLN
4.0000 mg | Freq: Once | INTRAMUSCULAR | Status: AC
  Administered 2020-12-13: 08:00:00 4 mg via INTRAVENOUS
  Filled 2020-12-13: qty 2

## 2020-12-13 MED ORDER — SODIUM CHLORIDE 0.9 % IV BOLUS
1000.0000 mL | Freq: Once | INTRAVENOUS | Status: AC
  Administered 2020-12-13: 08:00:00 1000 mL via INTRAVENOUS

## 2020-12-13 NOTE — ED Notes (Signed)
PO fluids give, pt encouraged to take small frequent sips

## 2020-12-13 NOTE — ED Triage Notes (Signed)
Pt c/o of LLQ abdominal pain with n/v/d since 0400

## 2020-12-13 NOTE — Discharge Instructions (Addendum)
It was our pleasure to provide your ER care today - we hope that you feel better.  Rest. Drink plenty of fluids.   Take zofran as need for nausea.   Note that increasingly we are seeing marijuana/THC as a cause of a recurrent abdominal pain and cyclical vomiting syndrome called 'Cannabinoid Hyperemesis Syndrome' - see attached information - in these cases or in patients susceptible to this, avoidable marijuana use prevents symptoms from recurring.   Return to ER if worse, new symptoms, fevers, worsening or severe abdominal pain, persistent vomiting, or other concern.   You were given pain medication in the ER - no driving for the next 6 hours.

## 2020-12-13 NOTE — ED Provider Notes (Signed)
[facility] EMERGENCY DEPARTMENT Provider Note   CSN: [id number] Arrival date & time: 12/13/20  1027     History Chief Complaint  Patient presents with  . Emesis    Edwin Oneill is a 29 y.o. male.  Patient c/o epigastric pain and nausea/vomiting. Symptoms acute onset last night, constant, dull, epigastric pain, non radiating. Several episodes of vomiting, clear to yellowish. No bloody emesis. One loose stool, no severe diarrhea. No abd distension. Denies hx gallstones, pud, or pancreatitis. No known bad food ingestion or ill contacts. Has had similar symptoms previously, unsure of cause. No back or flank pain. No gu c/o.   The history is provided by the patient.  Emesis Associated symptoms: abdominal pain   Associated symptoms: no cough, no fever, no headaches and no sore throat        History reviewed. No pertinent past medical history.  Patient Active Problem List   Diagnosis Date Noted  . KNEE PAIN, RIGHT 10/11/2007    Past Surgical History:  Procedure Laterality Date  . ANKLE SURGERY Right        History reviewed. No pertinent family history.  Social History   Tobacco Use  . Smoking status: Current Every Day Smoker    Packs/day: 1.50    Types: Cigarettes  . Smokeless tobacco: Never Used  Substance Use Topics  . Alcohol use: Yes    Alcohol/week: 9.0 standard drinks    Types: 9 Shots of liquor per week    Comment: occ  . Drug use: No    Home Medications Prior to Admission medications   Medication Sig Start Date End Date Taking? Authorizing Provider  dicyclomine (BENTYL) 20 MG tablet Take one every 8 hours as needed for abdominal pain 10/03/20   Bethann Berkshire, MD  ondansetron (ZOFRAN ODT) 4 MG disintegrating tablet 4mg  ODT q4 hours prn nausea/vomit 10/03/20   12/03/20, MD    Allergies    Solbar pf spf15  Review of Systems   Review of Systems  Constitutional: Negative for fever.  HENT: Negative for sore throat.   Eyes: Negative for  redness.  Respiratory: Negative for cough and shortness of breath.   Cardiovascular: Negative for chest pain.  Gastrointestinal: Positive for abdominal pain and vomiting.  Genitourinary: Negative for dysuria and flank pain.  Musculoskeletal: Negative for back pain and neck pain.  Skin: Negative for rash.  Neurological: Negative for headaches.  Hematological: Does not bruise/bleed easily.  Psychiatric/Behavioral: Negative for confusion.    Physical Exam Updated Vital Signs Ht 1.905 m (6\' 3" )   Wt 70.3 kg   BMI 19.37 kg/m   Physical Exam Vitals and nursing note reviewed.  Constitutional:      Appearance: Normal appearance. He is well-developed.  HENT:     Head: Atraumatic.     Nose: Nose normal.     Mouth/Throat:     Mouth: Mucous membranes are moist.     Pharynx: Oropharynx is clear.  Eyes:     General: No scleral icterus.    Conjunctiva/sclera: Conjunctivae normal.  Neck:     Trachea: No tracheal deviation.  Cardiovascular:     Rate and Rhythm: Normal rate and regular rhythm.     Pulses: Normal pulses.     Heart sounds: Normal heart sounds. No murmur heard. No friction rub. No gallop.   Pulmonary:     Effort: Pulmonary effort is normal. No accessory muscle usage or respiratory distress.     Breath sounds: Normal breath sounds.  Abdominal:  General: Bowel sounds are normal. There is no distension.     Palpations: Abdomen is soft.     Tenderness: There is abdominal tenderness. There is no guarding.     Comments: Epigastric tenderness.   Genitourinary:    Comments: No cva tenderness. Musculoskeletal:        General: No swelling.     Cervical back: Normal range of motion and neck supple. No rigidity.  Skin:    General: Skin is warm and dry.     Findings: No rash.  Neurological:     Mental Status: He is alert.     Comments: Alert, speech clear.   Psychiatric:        Mood and Affect: Mood normal.     ED Results / Procedures / Treatments   Labs (all labs  ordered are listed, but only abnormal results are displayed) Results for orders placed or performed during the hospital encounter of 12/13/20  CBC  Result Value Ref Range   WBC 11.7 (H) 4.0 - 10.5 K/uL   RBC 4.44 4.22 - 5.81 MIL/uL   Hemoglobin 14.5 13.0 - 17.0 g/dL   HCT 56.2 13.0 - 86.5 %   MCV 94.4 80.0 - 100.0 fL   MCH 32.7 26.0 - 34.0 pg   MCHC 34.6 30.0 - 36.0 g/dL   RDW 78.4 69.6 - 29.5 %   Platelets 244 150 - 400 K/uL   nRBC 0.0 0.0 - 0.2 %  Comprehensive metabolic panel  Result Value Ref Range   Sodium 138 135 - 145 mmol/L   Potassium 3.9 3.5 - 5.1 mmol/L   Chloride 104 98 - 111 mmol/L   CO2 27 22 - 32 mmol/L   Glucose, Bld 101 (H) 70 - 99 mg/dL   BUN 8 6 - 20 mg/dL   Creatinine, Ser 2.84 0.61 - 1.24 mg/dL   Calcium 9.3 8.9 - 13.2 mg/dL   Total Protein 7.5 6.5 - 8.1 g/dL   Albumin 4.4 3.5 - 5.0 g/dL   AST 27 15 - 41 U/L   ALT 15 0 - 44 U/L   Alkaline Phosphatase 40 38 - 126 U/L   Total Bilirubin 0.9 0.3 - 1.2 mg/dL   GFR, Estimated >44 >01 mL/min   Anion gap 7 5 - 15  Lipase, blood  Result Value Ref Range   Lipase 22 11 - 51 U/L  Rapid urine drug screen (hospital performed)  Result Value Ref Range   Opiates NONE DETECTED NONE DETECTED   Cocaine NONE DETECTED NONE DETECTED   Benzodiazepines NONE DETECTED NONE DETECTED   Amphetamines NONE DETECTED NONE DETECTED   Tetrahydrocannabinol POSITIVE (A) NONE DETECTED   Barbiturates NONE DETECTED NONE DETECTED  Urinalysis, Routine w reflex microscopic Urine, Clean Catch  Result Value Ref Range   Color, Urine YELLOW YELLOW   APPearance CLOUDY (A) CLEAR   Specific Gravity, Urine 1.017 1.005 - 1.030   pH 7.0 5.0 - 8.0   Glucose, UA NEGATIVE NEGATIVE mg/dL   Hgb urine dipstick NEGATIVE NEGATIVE   Bilirubin Urine NEGATIVE NEGATIVE   Ketones, ur NEGATIVE NEGATIVE mg/dL   Protein, ur 30 (A) NEGATIVE mg/dL   Nitrite NEGATIVE NEGATIVE   Leukocytes,Ua NEGATIVE NEGATIVE   RBC / HPF 0-5 0 - 5 RBC/hpf   WBC, UA 6-10 0 - 5  WBC/hpf   Bacteria, UA NONE SEEN NONE SEEN   WBC Clumps PRESENT    Mucus PRESENT     EKG None  Radiology No results found.  Procedures  Procedures (including critical care time)  Medications Ordered in ED Medications  sodium chloride 0.9 % bolus 1,000 mL (has no administration in time range)  HYDROmorphone (DILAUDID) injection 0.5 mg (has no administration in time range)  ondansetron (ZOFRAN) injection 4 mg (has no administration in time range)  famotidine (PEPCID) IVPB 20 mg premix (has no administration in time range)    ED Course  I have reviewed the triage vital signs and the nursing notes.  Pertinent labs & imaging results that were available during my care of the patient were reviewed by me and considered in my medical decision making (see chart for details).    MDM Rules/Calculators/A&P                          Iv ns bolus. zofran iv, pepcid iv, dilaudid iv. Labs sent.  Reviewed nursing notes and prior charts for additional history. Pt with a few prior ED visits with similar symptoms - prior ct neg for acute process.  Labs reviewed/interpreted by me - chem normal.  Pt appears anxious. Will also try ativan 1 mg iv for symptom relief/improvement.   Trial of po fluids.  Pt tolerating po, no recurrent nausea/vomiting. Recheck abd soft non tender.   Pt currently appears stable for d/c.   Return precautions provided.      Final Clinical Impression(s) / ED Diagnoses Final diagnoses:  None    Rx / DC Orders ED Discharge Orders    None       Cathren Laine, MD 12/13/20 1053

## 2020-12-24 ENCOUNTER — Other Ambulatory Visit: Payer: Self-pay

## 2020-12-24 ENCOUNTER — Emergency Department (HOSPITAL_COMMUNITY): Admission: EM | Admit: 2020-12-24 | Discharge: 2020-12-24 | Discharge status: Against Medical Advice | Payer: Self-pay

## 2021-05-12 ENCOUNTER — Other Ambulatory Visit: Payer: Self-pay

## 2021-05-12 ENCOUNTER — Emergency Department (HOSPITAL_COMMUNITY)
Admission: EM | Admit: 2021-05-12 | Discharge: 2021-05-12 | Disposition: A | Discharge status: Home / Self Care | Payer: Self-pay | Attending: Emergency Medicine | Admitting: Emergency Medicine

## 2021-05-12 ENCOUNTER — Encounter (HOSPITAL_COMMUNITY): Payer: Self-pay

## 2021-05-12 DIAGNOSIS — F17210 Nicotine dependence, cigarettes, uncomplicated: Secondary | ICD-10-CM | POA: Insufficient documentation

## 2021-05-12 DIAGNOSIS — U071 COVID-19: Secondary | ICD-10-CM | POA: Insufficient documentation

## 2021-05-12 LAB — COMPREHENSIVE METABOLIC PANEL
ALT: 14 U/L (ref 0–44)
AST: 26 U/L (ref 15–41)
Albumin: 3.9 g/dL (ref 3.5–5.0)
Alkaline Phosphatase: 37 U/L — ABNORMAL LOW (ref 38–126)
Anion gap: 9 (ref 5–15)
BUN: 14 mg/dL (ref 6–20)
CO2: 26 mmol/L (ref 22–32)
Calcium: 8.9 mg/dL (ref 8.9–10.3)
Chloride: 101 mmol/L (ref 98–111)
Creatinine, Ser: 1.39 mg/dL — ABNORMAL HIGH (ref 0.61–1.24)
GFR, Estimated: >60 mL/min (ref >60)
Glucose, Bld: 123 mg/dL — ABNORMAL HIGH (ref 70–99)
Potassium: 3.3 mmol/L — ABNORMAL LOW (ref 3.5–5.1)
Sodium: 136 mmol/L (ref 135–145)
Total Bilirubin: 0.7 mg/dL (ref 0.3–1.2)
Total Protein: 6.7 g/dL (ref 6.5–8.1)

## 2021-05-12 LAB — URINALYSIS, ROUTINE W REFLEX MICROSCOPIC
Bacteria, UA: NONE SEEN
Bilirubin Urine: NEGATIVE
Glucose, UA: NEGATIVE mg/dL
Hgb urine dipstick: NEGATIVE
Ketones, ur: NEGATIVE mg/dL
Leukocytes,Ua: NEGATIVE
Nitrite: NEGATIVE
Protein, ur: 30 mg/dL — AB
Specific Gravity, Urine: 1.028 (ref 1.005–1.030)
pH: 6 (ref 5.0–8.0)

## 2021-05-12 LAB — CBC WITH DIFFERENTIAL/PLATELET
Abs Immature Granulocytes: 0.02 10*3/uL (ref 0.00–0.07)
Basophils Absolute: 0 10*3/uL (ref 0.0–0.1)
Basophils Relative: 0 %
Eosinophils Absolute: 0 10*3/uL (ref 0.0–0.5)
Eosinophils Relative: 0 %
HCT: 38.2 % — ABNORMAL LOW (ref 39.0–52.0)
Hemoglobin: 13 g/dL (ref 13.0–17.0)
Immature Granulocytes: 1 %
Lymphocytes Relative: 6 %
Lymphs Abs: 0.2 10*3/uL — ABNORMAL LOW (ref 0.7–4.0)
MCH: 32.5 pg (ref 26.0–34.0)
MCHC: 34 g/dL (ref 30.0–36.0)
MCV: 95.5 fL (ref 80.0–100.0)
Monocytes Absolute: 0.7 10*3/uL (ref 0.1–1.0)
Monocytes Relative: 17 %
Neutro Abs: 3.1 10*3/uL (ref 1.7–7.7)
Neutrophils Relative %: 76 %
Platelets: 144 10*3/uL — ABNORMAL LOW (ref 150–400)
RBC: 4 MIL/uL — ABNORMAL LOW (ref 4.22–5.81)
RDW: 12.6 % (ref 11.5–15.5)
WBC: 4 10*3/uL (ref 4.0–10.5)
nRBC: 0 % (ref 0.0–0.2)

## 2021-05-12 LAB — RESP PANEL BY RT-PCR (FLU A&B, COVID) ARPGX2
Influenza A by PCR: NEGATIVE
Influenza B by PCR: NEGATIVE
SARS Coronavirus 2 by RT PCR: POSITIVE — AB

## 2021-05-12 LAB — LIPASE, BLOOD: Lipase: 22 U/L (ref 11–51)

## 2021-05-12 MED ORDER — SODIUM CHLORIDE 0.9 % IV BOLUS
1000.0000 mL | Freq: Once | INTRAVENOUS | Status: AC
  Administered 2021-05-12: 1000 mL via INTRAVENOUS

## 2021-05-12 MED ORDER — POTASSIUM CHLORIDE CRYS ER 20 MEQ PO TBCR
40.0000 meq | EXTENDED_RELEASE_TABLET | Freq: Once | ORAL | Status: AC
  Administered 2021-05-12: 40 meq via ORAL
  Filled 2021-05-12: qty 2

## 2021-05-12 MED ORDER — SODIUM CHLORIDE 0.9 % IV BOLUS: 1000.0000 mL | Freq: Once | INTRAVENOUS | Status: DC

## 2021-05-12 MED ORDER — ACETAMINOPHEN 500 MG PO TABS
1000.0000 mg | ORAL_TABLET | Freq: Once | ORAL | Status: AC
  Administered 2021-05-12: 1000 mg via ORAL
  Filled 2021-05-12: qty 2

## 2021-05-12 MED ORDER — ONDANSETRON HCL 4 MG/2ML IJ SOLN
4.0000 mg | Freq: Once | INTRAMUSCULAR | Status: AC
  Administered 2021-05-12: 4 mg via INTRAVENOUS
  Filled 2021-05-12: qty 2

## 2021-05-12 NOTE — ED Provider Notes (Addendum)
[facility] EMERGENCY DEPARTMENT Provider Note   CSN: [id number] Arrival date & time: 05/12/21  6812     History Chief Complaint  Patient presents with  . Multiple Complaints    Edwin Oneill is a 30 y.o. male with chief complaint of URI symptoms that started yesterday.  Patient complains of chills, headache, myalgias, nausea vomiting that began yesterday.  Denies any cough, sore throat, fevers that he is aware of.  Denies any congestion or sick contacts.  Has not been vaccinated against COVID.  States that he is generally healthy.  States that he vomited about 13 times since yesterday, unable to keep anything down.  Denies any abdominal pain.  Myalgias are primarily in his back, had, lower extremities including his groin area.  Denies any penile discharge, penile pain, scrotal pain, scrotal swelling.  States that he took an Excedrin without relief, has not tried anything else.  Denies any diarrhea, chest pain or shortness of breath.  Denies any dysuria or hematuria.  HPI     History reviewed. No pertinent past medical history.  Patient Active Problem List   Diagnosis Date Noted  . KNEE PAIN, RIGHT 10/11/2007    Past Surgical History:  Procedure Laterality Date  . ANKLE SURGERY Right        No family history on file.  Social History   Tobacco Use  . Smoking status: Current Every Day Smoker    Packs/day: 1.50    Types: Cigarettes  . Smokeless tobacco: Never Used  Substance Use Topics  . Alcohol use: Yes    Alcohol/week: 9.0 standard drinks    Types: 9 Shots of liquor per week    Comment: occ  . Drug use: No    Home Medications Prior to Admission medications   Medication Sig Start Date End Date Taking? Authorizing Provider  ondansetron (ZOFRAN ODT) 8 MG disintegrating tablet Take 1 tablet (8 mg total) by mouth every 8 (eight) hours as needed for nausea or vomiting. 12/13/20   Cathren Laine, MD    Allergies    Solbar pf spf15  Review of Systems   Review of  Systems  Constitutional: Positive for chills. Negative for diaphoresis, fatigue and fever.  HENT: Negative for congestion, sore throat and trouble swallowing.   Eyes: Negative for pain and visual disturbance.  Respiratory: Negative for cough, shortness of breath and wheezing.   Cardiovascular: Negative for chest pain, palpitations and leg swelling.  Gastrointestinal: Negative for abdominal distention, abdominal pain, diarrhea, nausea and vomiting.  Genitourinary: Negative for difficulty urinating, dysuria, enuresis, flank pain, genital sores, hematuria, penile discharge, penile pain, penile swelling, scrotal swelling, testicular pain and urgency.       Groin pain   Musculoskeletal: Positive for arthralgias, back pain and myalgias. Negative for neck pain and neck stiffness.  Skin: Negative for pallor.  Neurological: Positive for headaches. Negative for dizziness, speech difficulty and weakness.  Psychiatric/Behavioral: Negative for confusion.    Physical Exam Updated Vital Signs BP (!) 114/54   Pulse (!) 57   Temp 98.8 F (37.1 C) (Oral)   Resp (!) 21   Ht 6\' 3"  (1.905 m)   Wt 72.6 kg   SpO2 97%   BMI 20.00 kg/m   Physical Exam Constitutional:      General: He is not in acute distress.    Appearance: Normal appearance. He is not ill-appearing, toxic-appearing or diaphoretic.  HENT:     Head: Normocephalic and atraumatic.     Nose: No congestion.  Mouth/Throat:     Mouth: Mucous membranes are dry.     Pharynx: Oropharynx is clear.  Eyes:     General: No scleral icterus.    Extraocular Movements: Extraocular movements intact.     Pupils: Pupils are equal, round, and reactive to light.  Neck:     Comments: No meningismus  Cardiovascular:     Rate and Rhythm: Normal rate and regular rhythm.     Pulses: Normal pulses.     Heart sounds: Normal heart sounds.  Pulmonary:     Effort: Pulmonary effort is normal. No respiratory distress.     Breath sounds: Normal breath  sounds. No stridor. No wheezing, rhonchi or rales.  Chest:     Chest wall: No tenderness.  Abdominal:     General: Abdomen is flat. There is no distension.     Palpations: Abdomen is soft.     Tenderness: There is no abdominal tenderness. There is no guarding or rebound.       Comments: Tenderness to palpation of bilateral inguinal canal, no inguinal lymph nodes noted.  Normal GU exam, chaperone present.  Genitourinary:    Penis: Normal. No erythema, tenderness, discharge or lesions.      Testes: Normal. Cremasteric reflex is present.        Right: Tenderness not present.        Left: Tenderness not present.     Epididymis:     Right: Normal. No tenderness.     Left: Normal. No tenderness.  Musculoskeletal:        General: No swelling or tenderness. Normal range of motion.     Cervical back: Normal range of motion and neck supple. No rigidity.       Back:     Right lower leg: No edema.     Left lower leg: No edema.     Comments: Patient with tenderness to palpation diffusely of lower back, does not cross midline.  No objective numbness.  No erythema or rashes noted.  Normal range of motion to back.  Lymphadenopathy:     Lower Body: No right inguinal adenopathy. No left inguinal adenopathy.  Skin:    General: Skin is warm and dry.     Capillary Refill: Capillary refill takes less than 2 seconds.     Coloration: Skin is not pale.     Findings: No rash.  Neurological:     General: No focal deficit present.     Mental Status: He is alert and oriented to person, place, and time.     Cranial Nerves: No cranial nerve deficit.     Sensory: No sensory deficit.     Motor: No weakness.     Coordination: Coordination normal.  Psychiatric:        Mood and Affect: Mood normal.        Behavior: Behavior normal.     ED Results / Procedures / Treatments   Labs (all labs ordered are listed, but only abnormal results are displayed) Labs Reviewed  RESP PANEL BY RT-PCR (FLU A&B, COVID)  ARPGX2 - Abnormal; Notable for the following components:      Result Value   SARS Coronavirus 2 by RT PCR POSITIVE (*)    All other components within normal limits  COMPREHENSIVE METABOLIC PANEL - Abnormal; Notable for the following components:   Potassium 3.3 (*)    Glucose, Bld 123 (*)    Creatinine, Ser 1.39 (*)    Alkaline Phosphatase 37 (*)  All other components within normal limits  CBC WITH DIFFERENTIAL/PLATELET - Abnormal; Notable for the following components:   RBC 4.00 (*)    HCT 38.2 (*)    Platelets 144 (*)    Lymphs Abs 0.2 (*)    All other components within normal limits  URINALYSIS, ROUTINE W REFLEX MICROSCOPIC - Abnormal; Notable for the following components:   APPearance HAZY (*)    Protein, ur 30 (*)    All other components within normal limits  LIPASE, BLOOD    EKG EKG Interpretation  Date/Time:  Wednesday May 12 2021 09:40:49 EDT Ventricular Rate:  62 PR Interval:  192 QRS Duration: 103 QT Interval:  389 QTC Calculation: 395 R Axis:   80 Text Interpretation: Sinus arrhythmia RSR' in V1 or V2, probably normal variant No significant change since last tracing Confirmed by Susy Frizzle [phone]) on 05/12/2021 9:42:59 AM   Radiology No results found.  Procedures Procedures   Medications Ordered in ED Medications  sodium chloride 0.9 % bolus 1,000 mL (has no administration in time range)  potassium chloride SA (KLOR-CON) CR tablet 40 mEq (has no administration in time range)  sodium chloride 0.9 % bolus 1,000 mL (1,000 mLs Intravenous New Bag/Given 05/12/21 1002)  ondansetron (ZOFRAN) injection 4 mg (4 mg Intravenous Given 05/12/21 1003)  acetaminophen (TYLENOL) tablet 1,000 mg (1,000 mg Oral Given 05/12/21 1002)    ED Course  I have reviewed the triage vital signs and the nursing notes.  Pertinent labs & imaging results that were available during my care of the patient were reviewed by me and considered in my medical decision making (see chart for  details).    MDM Rules/Calculators/A&P                          Patient presents to the emergency department today for URI symptoms, suspicion for COVID at this time, patient has not been vaccinated.  Emergency groin pain at this is most likely just MSK from myalgias.  Nontoxic appearing, hemodynamically stable.  No chest pain or shortness of breath.  Patient does appear slightly dry, blood pressure 100/57, will give liter of fluid and reevaluate.  COVID test positive, work-up today unremarkable.  Creatinine 1.2 about 7 months ago, is 1.39 today, have given a liter of fluid.  Patient passed p.o. challenge, asking to leave this time.  Repeat systolic 114, to be discharged.  Symptomatic treatment discussed, patient will follow up with post-COVID care clinic.  No need for antivirals at this time, patient is low risk, however did have shared decision-making about Plaxovid at this time, we decided against this.  Doubt need for further emergent work up at this time. I explained the diagnosis and have given explicit precautions to return to the ER including for any other new or worsening symptoms. The patient understands and accepts the medical plan as it's been dictated and I have answered their questions. Discharge instructions concerning home care and prescriptions have been given. The patient is STABLE and is discharged to home in good condition.  CATARINO VOLD was evaluated in Emergency Department on 05/12/2021 for the symptoms described in the history of present illness. He was evaluated in the context of the global COVID-19 pandemic, which necessitated consideration that the patient might be at risk for infection with the SARS-CoV-2 virus that causes COVID-19. Institutional protocols and algorithms that pertain to the evaluation of patients at risk for COVID-19 are in a state of rapid  change based on information released by regulatory bodies including the CDC and federal and state organizations. These  policies and algorithms were followed during the patient's care in the ED.  Final Clinical Impression(s) / ED Diagnoses Final diagnoses:  COVID    Rx / DC Orders ED Discharge Orders    None          Farrel Gordonatel, Terrea Bruster, PA-C 05/12/21 1139    Pollyann SavoySheldon, Charles B, MD 05/12/21 [phone]

## 2021-05-12 NOTE — ED Notes (Signed)
Pt c/o back pain, chills, n/v, unable to eat but able to tolerate liquids, and migraine around his forehead since yesterday. Pt has only taken Excedrin extra strength one time with no relieve.

## 2021-05-12 NOTE — ED Triage Notes (Signed)
Pt presents to ED with complaints of migraine, groin pain, and lower back pain started yesterday.

## 2021-05-12 NOTE — Discharge Instructions (Signed)
  Your COVID test was positive today.  Please follow CDC guidelines and isolate for the next 5 days, make sure to wear a mask for the remainder of 10 days.  Take Tylenol as directed on the bottle for pain and make sure to hydrate.  If your symptoms become worse or he develops shortness of breath please come back here, you can follow-up with your primary care or the post-COVID care center which is attached.  Use the attachments.   Please return to the Emergency Department if you experience any worsening of your condition.  Thank you for allowing [place] to be a part of your care. Please speak to your pharmacist about any new medications prescribed today in regards to side effects or interactions with other medications.

## 2023-09-01 ENCOUNTER — Emergency Department (HOSPITAL_COMMUNITY)
Admission: EM | Admit: 2023-09-01 | Discharge: 2023-09-01 | Disposition: A | Discharge status: Home / Self Care | Payer: Self-pay | Attending: Emergency Medicine | Admitting: Emergency Medicine

## 2023-09-01 ENCOUNTER — Other Ambulatory Visit: Payer: Self-pay

## 2023-09-01 ENCOUNTER — Encounter (HOSPITAL_COMMUNITY): Payer: Self-pay | Admitting: Emergency Medicine

## 2023-09-01 DIAGNOSIS — K011 Impacted teeth: Secondary | ICD-10-CM | POA: Insufficient documentation

## 2023-09-01 MED ORDER — AMOXICILLIN 250 MG PO CAPS
1000.0000 mg | ORAL_CAPSULE | Freq: Once | ORAL | Status: AC
  Administered 2023-09-01: 1000 mg via ORAL
  Filled 2023-09-01: qty 4

## 2023-09-01 MED ORDER — AMOXICILLIN 500 MG PO CAPS: 1000.0000 mg | ORAL_CAPSULE | Freq: Two times a day (BID) | ORAL | 0 refills | Status: AC

## 2023-09-01 MED ORDER — OXYCODONE-ACETAMINOPHEN 5-325 MG PO TABS: 1.0000 | ORAL_TABLET | ORAL | 0 refills | Status: AC | PRN

## 2023-09-01 NOTE — Discharge Instructions (Signed)
He will need to see a dentist about her tooth.  I actually expect that she will need to see an oral surgeon.  Your dentist can arrange further referral if that is what is needed.  You may take ibuprofen/or acetaminophen as needed for pain.  Reserve the narcotic for pain not relieved by the combination of ibuprofen and acetaminophen.  It is very important that you do not take narcotic pain medication such as oxycodone within 4 hours of driving a car.

## 2023-09-01 NOTE — ED Provider Notes (Signed)
[facility] EMERGENCY DEPARTMENT AT [facility] Provider Note   CSN: [id number] Arrival date & time: 09/01/23  0459     History  Chief Complaint  Patient presents with   Dental Pain    Edwin Oneill is a 32 y.o. male.  The history is provided by the patient.  Dental Pain He complains of pain in her right lower molar since yesterday.  He has taken ibuprofen without relief.   Home Medications Prior to Admission medications   Medication Sig Start Date End Date Taking? Authorizing Provider  ondansetron (ZOFRAN ODT) 8 MG disintegrating tablet Take 1 tablet (8 mg total) by mouth every 8 (eight) hours as needed for nausea or vomiting. 12/13/20   Cathren Laine, MD      Allergies    Solbar pf spf15    Review of Systems   Review of Systems  All other systems reviewed and are negative.   Physical Exam Updated Vital Signs BP 129/80   Pulse (!) 56   Temp 97.6 F (36.4 C) (Oral)   Resp 20   Ht 6\' 3"  (1.905 m)   Wt 72.6 kg   SpO2 98%   BMI 20.00 kg/m  Physical Exam Vitals and nursing note reviewed.   32 year old male, resting comfortably and in no acute distress. Vital signs are normal. Oxygen saturation is 98%, which is normal. Head is normocephalic and atraumatic. PERRLA, EOMI. Oropharynx is clear.  Tooth #32 is impacted without any clear caries. Neck is nontender and supple without adenopathyntender and there is no CVA tenderness. Lungs are clear without rales, wheezes, or rhonchi. Chest is nontender. Heart has regular rate and rhythm without murmur. Abdomen is soft, flat, nontende. Neurologic: Mental status is normal, cranial nerves are intact, moves all extremities equally.  ED Results / Procedures / Treatments    Procedures Procedures    Medications Ordered in ED Medications  amoxicillin (AMOXIL) capsule 1,000 mg (1,000 mg Oral Given 09/01/23 0535)    ED Course/ Medical Decision Making/ A&P                                 Medical Decision  Making Risk Prescription drug management.   Impacted tooth #32.  Patient is visiting from [place], planning to return in 3 days.  I have ordered a dose of amoxicillin and I am discharging her with a prescription for amoxicillin and a small number of oxycodone-acetaminophen tablets including a take-home pack.  I have advised him that he will likely need to see an oral surgeon, contact his dentist today to see if he can get an appointment for the day after he returns to [place].  He is to continue taking acetaminophen and/or ibuprofen as needed for pain which does not require narcotics.  He is cautioned that it is unsafe to drive under the influence of narcotics.  Final Clinical Impression(s) / ED Diagnoses Final diagnoses:  Impacted third molar tooth    Rx / DC Orders ED Discharge Orders          Ordered    oxyCODONE-acetaminophen (PERCOCET) 5-325 MG tablet  Every 4 hours PRN        09/01/23 0533    oxyCODONE-acetaminophen (PERCOCET) 5-325 MG tablet  Every 4 hours PRN        09/01/23 0533    amoxicillin (AMOXIL) 500 MG capsule  2 times daily        09/01/23  1761              Dione Booze, MD 09/01/23 [phone]

## 2023-09-01 NOTE — ED Triage Notes (Signed)
Pt c/o dental pain to right lower back molar since yesterday. Denies any fevers.

## 2023-09-05 MED FILL — Oxycodone w/ Acetaminophen Tab 5-325 MG: ORAL | Qty: 6 | Status: AC
